# Patient Record
Sex: Male | Born: 1955 | Race: Black or African American | Hispanic: No | Marital: Single | Smoking: Former smoker
Health system: Southern US, Community
[De-identification: ages and names within clinical notes are randomized; demographics above are authoritative.]

## PROBLEM LIST (undated history)

## (undated) DIAGNOSIS — I4892 Unspecified atrial flutter: Secondary | ICD-10-CM

## (undated) DIAGNOSIS — G4730 Sleep apnea, unspecified: Secondary | ICD-10-CM

## (undated) DIAGNOSIS — E119 Type 2 diabetes mellitus without complications: Secondary | ICD-10-CM

## (undated) DIAGNOSIS — F32A Depression, unspecified: Secondary | ICD-10-CM

## (undated) DIAGNOSIS — K219 Gastro-esophageal reflux disease without esophagitis: Secondary | ICD-10-CM

## (undated) DIAGNOSIS — I10 Essential (primary) hypertension: Secondary | ICD-10-CM

## (undated) DIAGNOSIS — F419 Anxiety disorder, unspecified: Secondary | ICD-10-CM

## (undated) DIAGNOSIS — R0602 Shortness of breath: Secondary | ICD-10-CM

## (undated) DIAGNOSIS — F329 Major depressive disorder, single episode, unspecified: Secondary | ICD-10-CM

## (undated) DIAGNOSIS — I5023 Acute on chronic systolic (congestive) heart failure: Secondary | ICD-10-CM

## (undated) HISTORY — PX: TONSILLECTOMY: SUR1361

## (undated) HISTORY — PX: CARDIAC CATHETERIZATION: SHX172

---

## 2013-05-06 ENCOUNTER — Emergency Department (INDEPENDENT_AMBULATORY_CARE_PROVIDER_SITE_OTHER): Payer: Self-pay

## 2013-05-06 ENCOUNTER — Inpatient Hospital Stay (HOSPITAL_COMMUNITY)
Admission: EM | Admit: 2013-05-06 | Discharge: 2013-05-12 | DRG: 308 | Disposition: A | Discharge status: Home / Self Care | Payer: Self-pay | Attending: Cardiology | Admitting: Cardiology

## 2013-05-06 ENCOUNTER — Encounter (HOSPITAL_COMMUNITY): Payer: Self-pay | Admitting: Emergency Medicine

## 2013-05-06 ENCOUNTER — Emergency Department (INDEPENDENT_AMBULATORY_CARE_PROVIDER_SITE_OTHER)
Admission: EM | Admit: 2013-05-06 | Discharge: 2013-05-06 | Disposition: A | Discharge status: Hospice | Payer: Self-pay | Source: Home / Self Care | Attending: Emergency Medicine | Admitting: Emergency Medicine

## 2013-05-06 DIAGNOSIS — Z79899 Other long term (current) drug therapy: Secondary | ICD-10-CM

## 2013-05-06 DIAGNOSIS — E876 Hypokalemia: Secondary | ICD-10-CM | POA: Diagnosis present

## 2013-05-06 DIAGNOSIS — I509 Heart failure, unspecified: Secondary | ICD-10-CM

## 2013-05-06 DIAGNOSIS — I209 Angina pectoris, unspecified: Secondary | ICD-10-CM

## 2013-05-06 DIAGNOSIS — I428 Other cardiomyopathies: Secondary | ICD-10-CM | POA: Diagnosis present

## 2013-05-06 DIAGNOSIS — Z23 Encounter for immunization: Secondary | ICD-10-CM

## 2013-05-06 DIAGNOSIS — I10 Essential (primary) hypertension: Secondary | ICD-10-CM

## 2013-05-06 DIAGNOSIS — N419 Inflammatory disease of prostate, unspecified: Secondary | ICD-10-CM

## 2013-05-06 DIAGNOSIS — E1142 Type 2 diabetes mellitus with diabetic polyneuropathy: Secondary | ICD-10-CM

## 2013-05-06 DIAGNOSIS — E1149 Type 2 diabetes mellitus with other diabetic neurological complication: Secondary | ICD-10-CM

## 2013-05-06 DIAGNOSIS — I119 Hypertensive heart disease without heart failure: Secondary | ICD-10-CM | POA: Diagnosis present

## 2013-05-06 DIAGNOSIS — I2789 Other specified pulmonary heart diseases: Secondary | ICD-10-CM | POA: Diagnosis present

## 2013-05-06 DIAGNOSIS — I4891 Unspecified atrial fibrillation: Secondary | ICD-10-CM

## 2013-05-06 DIAGNOSIS — I5023 Acute on chronic systolic (congestive) heart failure: Secondary | ICD-10-CM

## 2013-05-06 DIAGNOSIS — E119 Type 2 diabetes mellitus without complications: Secondary | ICD-10-CM

## 2013-05-06 DIAGNOSIS — I4892 Unspecified atrial flutter: Principal | ICD-10-CM

## 2013-05-06 DIAGNOSIS — F17200 Nicotine dependence, unspecified, uncomplicated: Secondary | ICD-10-CM | POA: Diagnosis present

## 2013-05-06 DIAGNOSIS — I110 Hypertensive heart disease with heart failure: Secondary | ICD-10-CM | POA: Diagnosis present

## 2013-05-06 DIAGNOSIS — Z7901 Long term (current) use of anticoagulants: Secondary | ICD-10-CM

## 2013-05-06 DIAGNOSIS — I5021 Acute systolic (congestive) heart failure: Secondary | ICD-10-CM | POA: Diagnosis present

## 2013-05-06 DIAGNOSIS — Z6841 Body Mass Index (BMI) 40.0 and over, adult: Secondary | ICD-10-CM

## 2013-05-06 DIAGNOSIS — Z7982 Long term (current) use of aspirin: Secondary | ICD-10-CM

## 2013-05-06 HISTORY — DX: Unspecified atrial flutter: I48.92

## 2013-05-06 HISTORY — DX: Shortness of breath: R06.02

## 2013-05-06 HISTORY — DX: Major depressive disorder, single episode, unspecified: F32.9

## 2013-05-06 HISTORY — DX: Type 2 diabetes mellitus without complications: E11.9

## 2013-05-06 HISTORY — DX: Gastro-esophageal reflux disease without esophagitis: K21.9

## 2013-05-06 HISTORY — DX: Acute on chronic systolic (congestive) heart failure: I50.23

## 2013-05-06 HISTORY — DX: Essential (primary) hypertension: I10

## 2013-05-06 HISTORY — DX: Sleep apnea, unspecified: G47.30

## 2013-05-06 HISTORY — DX: Depression, unspecified: F32.A

## 2013-05-06 HISTORY — DX: Anxiety disorder, unspecified: F41.9

## 2013-05-06 LAB — CBC
HCT: 39.6 % (ref 39.0–52.0)
MCHC: 32.8 g/dL (ref 30.0–36.0)
MCV: 81.8 fL (ref 78.0–100.0)
Platelets: 157 10*3/uL (ref 150–400)
RBC: 4.84 MIL/uL (ref 4.22–5.81)
RDW: 15.5 % (ref 11.5–15.5)

## 2013-05-06 LAB — MRSA PCR SCREENING: MRSA by PCR: NEGATIVE

## 2013-05-06 LAB — POCT I-STAT TROPONIN I: Troponin i, poc: 0.05 ng/mL (ref 0.00–0.08)

## 2013-05-06 LAB — BASIC METABOLIC PANEL
BUN: 14 mg/dL (ref 6–23)
Calcium: 8.6 mg/dL (ref 8.4–10.5)
Creatinine, Ser: 0.95 mg/dL (ref 0.50–1.35)
GFR calc Af Amer: >90 mL/min (ref >90)
GFR calc non Af Amer: >90 mL/min (ref >90)
Potassium: 3.5 mEq/L (ref 3.5–5.1)

## 2013-05-06 LAB — GLUCOSE, CAPILLARY: Glucose-Capillary: 172 mg/dL — ABNORMAL HIGH (ref 70–99)

## 2013-05-06 MED ORDER — SODIUM CHLORIDE 0.45 % IV SOLN: INTRAVENOUS | Status: DC

## 2013-05-06 MED ORDER — NITROGLYCERIN 0.4 MG SL SUBL
SUBLINGUAL_TABLET | SUBLINGUAL | Status: AC
  Filled 2013-05-06: qty 25

## 2013-05-06 MED ORDER — ASPIRIN 81 MG PO CHEW
CHEWABLE_TABLET | ORAL | Status: AC
  Filled 2013-05-06: qty 1

## 2013-05-06 MED ORDER — DILTIAZEM HCL 100 MG IV SOLR
5.0000 mg/h | INTRAVENOUS | Status: DC
  Administered 2013-05-06 – 2013-05-08 (×5): 10 mg/h via INTRAVENOUS
  Filled 2013-05-06 (×6): qty 100

## 2013-05-06 MED ORDER — DILTIAZEM LOAD VIA INFUSION
15.0000 mg | Freq: Once | INTRAVENOUS | Status: AC
  Administered 2013-05-06: 15 mg via INTRAVENOUS
  Filled 2013-05-06 (×2): qty 15

## 2013-05-06 MED ORDER — POTASSIUM CHLORIDE CRYS ER 20 MEQ PO TBCR
40.0000 meq | EXTENDED_RELEASE_TABLET | Freq: Two times a day (BID) | ORAL | Status: DC
  Administered 2013-05-06 – 2013-05-12 (×12): 40 meq via ORAL
  Filled 2013-05-06 (×16): qty 2

## 2013-05-06 MED ORDER — ASPIRIN 81 MG PO CHEW
324.0000 mg | CHEWABLE_TABLET | Freq: Once | ORAL | Status: AC
  Administered 2013-05-06: 324 mg via ORAL

## 2013-05-06 MED ORDER — SODIUM CHLORIDE 0.9 % IV SOLN
INTRAVENOUS | Status: DC
  Administered 2013-05-06: 12:00:00 via INTRAVENOUS

## 2013-05-06 MED ORDER — INFLUENZA VAC SPLIT QUAD 0.5 ML IM SUSP
0.5000 mL | INTRAMUSCULAR | Status: AC
  Administered 2013-05-07: 0.5 mL via INTRAMUSCULAR
  Filled 2013-05-06: qty 0.5

## 2013-05-06 MED ORDER — PNEUMOCOCCAL VAC POLYVALENT 25 MCG/0.5ML IJ INJ
0.5000 mL | INJECTION | INTRAMUSCULAR | Status: AC
  Administered 2013-05-07: 0.5 mL via INTRAMUSCULAR
  Filled 2013-05-06: qty 0.5

## 2013-05-06 MED ORDER — FUROSEMIDE 10 MG/ML IJ SOLN
60.0000 mg | Freq: Once | INTRAMUSCULAR | Status: AC
  Administered 2013-05-06: 60 mg via INTRAVENOUS
  Filled 2013-05-06: qty 6

## 2013-05-06 MED ORDER — ASPIRIN 81 MG PO CHEW
CHEWABLE_TABLET | ORAL | Status: AC
  Filled 2013-05-06: qty 3

## 2013-05-06 NOTE — ED Notes (Signed)
Pharmacy states they are currently mixing the cardizem drip, MD aware of cause for delay, will continue to monitor

## 2013-05-06 NOTE — H&P (Addendum)
Patient ID: Adam Lynn MRN: [id number], DOB/AGE: [birth year]   Admit date: 05/06/2013   Primary Physician: No PCP Per Patient Primary Cardiologist: Gentry Fitz Pt. Profile:  CHF, chest pain  Problem List  History reviewed. No pertinent past medical history.  History reviewed. No pertinent past surgical history.   Allergies  No Known Allergies  HPI  57 year old male with no prior medical history, on no medical therapy, ongoing smoker who presented with progressively worsening shortness of breath over the past week associated with chest tightness/heaviness. He denies any palpitations or syncope. He also has orthopnea and PND. He is currently NYHA class III, unable to walk even short distances without getting short of breath.  He was found to be in atrial flutter and started on Cardizem drip by the ER physician.  He has no prior h/o CAD or CHF or rhythm abnormalities. He has recently started a new exercise plan and has lost 50 pounds of weight. He denies nausea and vomiting. He states he had a heart catheterization done in [place] several years ago and had no stents placed at that time.   Home Medications  Prior to Admission medications   Medication Sig Start Date End Date Taking? Authorizing Provider  aspirin EC 81 MG tablet Take 81 mg by mouth daily.   Yes Historical Provider, MD  guaiFENesin (MUCINEX) 600 MG 12 hr tablet Take 600 mg by mouth 2 (two) times daily.   Yes Historical Provider, MD  hydrochlorothiazide (HYDRODIURIL) 25 MG tablet Take 25 mg by mouth daily.   Yes Historical Provider, MD  metFORMIN (GLUCOPHAGE) 500 MG tablet Take 500 mg by mouth daily with breakfast.   Yes Historical Provider, MD  quinapril (ACCUPRIL) 10 MG tablet Take 10 mg by mouth at bedtime.   Yes Historical Provider, MD  sitaGLIPtin (JANUVIA) 50 MG tablet Take 50 mg by mouth daily.   Yes Historical Provider, MD    Family History  History reviewed. No pertinent family history.  Social  History  History   Social History  . Marital Status: Single    Spouse Name: N/A    Number of Children: N/A  . Years of Education: N/A   Occupational History  . Not on file.   Social History Main Topics  . Smoking status: Not on file  . Smokeless tobacco: Not on file  . Alcohol Use: Not on file  . Drug Use: Not on file  . Sexual Activity: Not on file   Other Topics Concern  . Not on file   Social History Narrative  . No narrative on file     Review of Systems General:  No chills, fever, night sweats or weight changes.  Cardiovascular:  No chest pain, dyspnea on exertion, edema, orthopnea, palpitations, paroxysmal nocturnal dyspnea. Dermatological: No rash, lesions/masses Respiratory: No cough, dyspnea Urologic: No hematuria, dysuria Abdominal:   No nausea, vomiting, diarrhea, bright red blood per rectum, melena, or hematemesis Neurologic:  No visual changes, wkns, changes in mental status. All other systems reviewed and are otherwise negative except as noted above.  Physical Exam  Blood pressure 137/104, pulse 128, resp. rate 26, SpO2 95.00%.  General: Pleasant, NAD Psych: Normal affect. Neuro: Alert and oriented X 3. Moves all extremities spontaneously. HEENT: Normal  Neck: Supple without bruits, JVD + 8 cm. Lungs:  Resp regular and unlabored, crackles B/L Heart: RRR no s3, s4, or murmurs. Abdomen: Soft, non-tender, non-distended, BS + x 4.  Extremities: No clubbing, cyanosis, minimal LE non-pitting edema.  DP/PT/Radials 2+ and equal bilaterally.  Labs  No results found for this basename: CKTOTAL, CKMB, TROPONINI,  in the last 72 hours Lab Results  Component Value Date   WBC 9.1 05/06/2013   HGB 13.0 05/06/2013   HCT 39.6 05/06/2013   MCV 81.8 05/06/2013   PLT 157 05/06/2013    Recent Labs Lab 05/06/13 1313  NA 138  K 3.5  CL 103  CO2 26  BUN 14  CREATININE 0.95  CALCIUM 8.6  GLUCOSE 198*   Radiology/Studies  Dg Chest 2 View  05/06/2013    CLINICAL DATA:  Productive cough with chest tightness for 4 days. History of diabetes and hypertension.  EXAM: CHEST  2 VIEW  COMPARISON:  None.  FINDINGS: The heart is mildly enlarged. There is vascular congestion with fissural thickening and small bilateral pleural effusions suspicious for mild congestive heart failure. No confluent airspace opacity is identified. There are degenerative changes throughout the spine.  IMPRESSION: Cardiomegaly with interstitial edema and small pleural effusions suspicious for mild congestive heart failure.   Electronically Signed   By: Roxy Horseman M.D.   On: 05/06/2013 11:38    ECG  Atrial flutter with 2:1 conduction  ASSESSMENT AND PLAN  57 year old male  1. Atrial flutter - with 2:1 conduction, ventricular rate 142 BPM, started on Cardizem drip, we will start Heparin as it has certainly started > 48 hours ago. The plan is to perform TEE with cardioversion tomorrow. We will admit him to the stepdown unit.   2. CHF - might be tachycardia induced - we will start iv Lasix, Crea is normal.  3. Chest tightness - most probably secondary to severe SOB, we will continue rule out ACS. First troponin negative  4. Hypokalemia - replace with KCl  5. Hypertension - on Cardizem right now, we will decide on long term medication based on the echocardiogram  6. Obesity - the patient is already involved in an exercise program  7. Smoking cessation - counselling provided  Admit to stepdown   Signed, Lars Masson, MD 05/06/2013, 6:44 PM

## 2013-05-06 NOTE — ED Notes (Signed)
Call pharmacy in reference to Cardizem, stated they would send it up when ready

## 2013-05-06 NOTE — ED Notes (Signed)
Cardiology at bedside.

## 2013-05-06 NOTE — ED Notes (Signed)
C/o sinus problems since weather has changed.  Patient states he has productive cough with different color mucous.  Patient states he has sob when he walks.  Patient states tailbone is sore and has pain when urinating.

## 2013-05-06 NOTE — ED Provider Notes (Signed)
CSN: [id number]     Arrival date & time 05/06/13  1308 History   First MD Initiated Contact with Patient 05/06/13 1350     Chief Complaint  Patient presents with  . Chest Pain    HPI Patient presents to the emergency department from urgent care today with worsening shortness of breath over the past 4 days with increasing orthopnea and lower extremity edema.  No prior history of congestive heart failure.  Chest x-ray was concerning for heart failure and thus the patient was sent to emergency department for evaluation.  Aspirin given at urgent care.  Patient did have some chest tightness and therefore sublingual nitroglycerin was given x2.  The patient denies chest pain at this time.  He has no palpitations.  He's never had an abnormal heart rhythm.  His recent start a new exercise plan and has lost 50 pounds of weight.  He denies nausea and vomiting.  He reports worsening exertional shortness of breath.  He states he had a heart catheterization done in [place] several years ago and had no stents placed at that time.    Past medical history: None Surgical history: None  Social history: Positive for tobacco  Review of Systems  All other systems reviewed and are negative.    Allergies  Review of patient's allergies indicates no known allergies.  Home Medications   Current Outpatient Rx  Name  Route  Sig  Dispense  Refill  . aspirin EC 81 MG tablet   Oral   Take 81 mg by mouth daily.         Marland Kitchen guaiFENesin (MUCINEX) 600 MG 12 hr tablet   Oral   Take 600 mg by mouth 2 (two) times daily.         . hydrochlorothiazide (HYDRODIURIL) 25 MG tablet   Oral   Take 25 mg by mouth daily.         . metFORMIN (GLUCOPHAGE) 500 MG tablet   Oral   Take 500 mg by mouth daily with breakfast.         . quinapril (ACCUPRIL) 10 MG tablet   Oral   Take 10 mg by mouth at bedtime.         . sitaGLIPtin (JANUVIA) 50 MG tablet   Oral   Take 50 mg by mouth daily.          BP 142/113   Pulse 56  Resp 19  SpO2 98% Physical Exam  Nursing note and vitals reviewed. Constitutional: He is oriented to person, place, and time. He appears well-developed and well-nourished.  HENT:  Head: Normocephalic and atraumatic.  Eyes: EOM are normal.  Neck: Normal range of motion.  Cardiovascular: Regular rhythm, normal heart sounds and intact distal pulses.   Tachycardia  Pulmonary/Chest: Effort normal. No respiratory distress. He has rales.  Abdominal: Soft. He exhibits no distension. There is no tenderness.  Musculoskeletal: Normal range of motion.  1+ lower extremity edema  Neurological: He is alert and oriented to person, place, and time.  Skin: Skin is warm and dry.  Psychiatric: He has a normal mood and affect. Judgment normal.    ED Course  Procedures (including critical care time) Labs Review Labs Reviewed  BASIC METABOLIC PANEL - Abnormal; Notable for the following:    Glucose, Bld 198 (*)    All other components within normal limits  PRO B NATRIURETIC PEPTIDE - Abnormal; Notable for the following:    Pro B Natriuretic peptide (BNP) 864.6 (*)  All other components within normal limits  CBC  POCT I-STAT TROPONIN I   Imaging Review Dg Chest 2 View  05/06/2013   CLINICAL DATA:  Productive cough with chest tightness for 4 days. History of diabetes and hypertension.  EXAM: CHEST  2 VIEW  COMPARISON:  None.  FINDINGS: The heart is mildly enlarged. There is vascular congestion with fissural thickening and small bilateral pleural effusions suspicious for mild congestive heart failure. No confluent airspace opacity is identified. There are degenerative changes throughout the spine.  IMPRESSION: Cardiomegaly with interstitial edema and small pleural effusions suspicious for mild congestive heart failure.   Electronically Signed   By: Roxy Horseman M.D.   On: 05/06/2013 11:38  I personally reviewed the imaging tests through PACS system I reviewed available ER/hospitalization  records through the EMR   ECG interpretation 1322pm  Date: 05/06/2013  Rate: 142  Rhythm: Tachycardic rate.  Possible atrial tachycardia versus 2:1 atrial flutter  QRS Axis: normal  Intervals: normal  ST/T Wave abnormalities: normal  Conduction Disutrbances: Right bundle branch block  Narrative Interpretation:   Old EKG Reviewed: No prior EKG available   ECG interpretation 1648  Date: 05/06/2013  Rate: 118  Rhythm: atrial flutter  QRS Axis: normal  Intervals: normal  ST/T Wave abnormalities: normal  Conduction Disutrbances: none  Narrative Interpretation:   Old EKG Reviewed: No significant changes noted          MDM   1. Atrial flutter   2. CHF (congestive heart failure)    Short consistent heart rate in the 140s.  This appears to be 2:1 atrial flutter.  Likely his atrial flutter is put him into congestive heart failure.  The patient will be started on a Cardizem drip.  He has no active chest pain at this time.  No ischemic changes on his EKG.  His initial troponin is normal.  [facility] to see    Lyanne Co, MD 05/06/13 1701

## 2013-05-06 NOTE — Discharge Planning (Signed)
[facility] Adam E.- Community Liaison  Patient is to follow up with the [facility] to establish primary care on 05/19/13 at 9:15am, patient will also be obtaining the orange card. Patient is aware of this appointment and was given the application with my contact information for any questions or concerns.

## 2013-05-06 NOTE — ED Notes (Signed)
Pt in c/o chest pain and shortness of breath x4 days, went into urgent care today for further evaluation, pt was tachycardic upon arrival, rating pain 4/10, chest xray completed at urgent care, pt has been c/o cough with yellow sputum during this time also, IV started at urgent care, ASA given at urgent care, nitro SL given x2

## 2013-05-06 NOTE — ED Provider Notes (Signed)
Chief Complaint:   Chief Complaint  Patient presents with  . Facial Pain  . Dysuria    History of Present Illness:   Adam Lynn is a 57 year old male with poorly controlled diabetes, poorly controlled hypertension, and history of cigarette smoking who presents with a multitude of complaints today. Among his complaints are chest tightness for the past 3 days. This has been localized to the upper chest with radiation to both shoulders. It's worse with exertion such as walking and better with rest. He's also had sweats. He denies any nausea or vomiting. He's had shortness of breath with exertion and ankle edema. He's had no definite history of cardiac disease and does not think he's ever had a heart attack in the past.  He also has had a 2 month history of nasal congestion with headache, facial pain, and diminished hearing on the left. He's had a cough productive yellow sputum with streaks of blood. He also has a five-day history of prostate pain and burning with urination. He has normal urinary flow. His toes feel numb and tingly and painful. He does not have a primary care physician. In He states his CABG range has been 130 to 160. He's not had any hypoglycemia or diabetic symptoms. He denies any history of kidney disease or eye problems. He admits he had a poor diet and eats mostly junk food.  Review of Systems:  Other than noted above, the patient denies any of the following symptoms. Systemic:  No fever, chills, sweats, or fatigue. ENT:  No nasal congestion, rhinorrhea, or sore throat. Pulmonary:  No cough, wheezing, shortness of breath, sputum production, hemoptysis. Cardiac:  No palpitations, rapid heartbeat, dizziness, presyncope or syncope. GI:  No abdominal pain, heartburn, nausea, or vomiting. Ext:  No leg pain or swelling.  PMFSH:  Past medical history, family history, social history, meds, and allergies were reviewed and updated as needed. He's allergic to penicillin. He takes  hydrochlorothiazide, Accupril, Januvia, and metformin. He has high blood pressure and diabetes.  Physical Exam:   Vital signs:  BP 165/118  Pulse 143  Temp(Src) 98.2 F (36.8 C) (Oral)  Resp 18  SpO2 96% Gen:  Alert, oriented, in no distress, skin warm and dry. Eye:  PERRL, lids and conjunctivas normal.  Sclera non-icteric. ENT:  Mucous membranes moist, pharynx clear. Both TMs are retracted, red, and dull. Neck:  Supple, no adenopathy or tenderness.  No JVD. Lungs:  Clear to auscultation, no wheezes, rales or rhonchi.  No respiratory distress. Heart:  Regular rhythm.  No gallops, murmers, clicks or rubs. Chest:  No chest wall tenderness. Abdomen:  Soft, nontender, no organomegaly or mass.  Bowel sounds normal.  No pulsatile abdominal mass or bruit. Ext:  No edema.  No calf tenderness and Homann's sign negative.  Pulses full and equal. Skin:  Warm and dry.  No rash.  Labs:   Results for orders placed during the hospital encounter of 05/06/13  GLUCOSE, CAPILLARY      Result Value Range   Glucose-Capillary 172 (*) 70 - 99 mg/dL     Radiology:  Dg Chest 2 View  05/06/2013   CLINICAL DATA:  Productive cough with chest tightness for 4 days. History of diabetes and hypertension.  EXAM: CHEST  2 VIEW  COMPARISON:  None.  FINDINGS: The heart is mildly enlarged. There is vascular congestion with fissural thickening and small bilateral pleural effusions suspicious for mild congestive heart failure. No confluent airspace opacity is identified. There are degenerative changes  throughout the spine.  IMPRESSION: Cardiomegaly with interstitial edema and small pleural effusions suspicious for mild congestive heart failure.   Electronically Signed   By: Roxy Horseman M.D.   On: 05/06/2013 11:38   I reviewed the images independently and personally and concur with the radiologist's findings.  EKG:   Date: 05/06/2013  Rate: 142  Rhythm: sinus tachycardia  QRS Axis: normal  Intervals: normal  ST/T Wave  abnormalities: normal  Conduction Disutrbances:right bundle branch block  Narrative Interpretation: Sinus tachycardia, right bundle branch block, septal infarct, age undetermined.  Old EKG Reviewed: none available  Course in Urgent Care Center:   He was begun on normal saline intravenously, oxygen via nasal cannula, monitored, and given aspirin 325 mg by mouth. He was not given nitroglycerin since he does not have any chest pain or discomfort right now.  Assessment:  The primary encounter diagnosis was Angina pectoris. Diagnoses of CHF (congestive heart failure), Prostatitis, and Type 2 diabetes mellitus with neurological manifestations were also pertinent to this visit.  Symptoms are concerning for new onset angina. He also appears to have symptoms and signs of congestive heart failure. Will need complete cardiac workup.  Plan:   The patient was transferred to the ED via [facility] in stable condition.  Medical Decision Making:  57 year old poorly controlled diabetic, smoker, poorly controlled HT presents today with 3 day history of upper chest tightness radiating to shoulders, worse with exertion, better with rest.  No pain right now.  He also has nasal congestion, cough, hemoptysis, sweats, prostate pain, dysuria, and numbness and pain in toes.  On exam he is diaphoretic and tachycardic.  His CBG is 172.  EKG shows sinus tach, RBBB, and previous septal infarct.  His CXR shows cardiomegaly and interstitial edema consistent with CHF.  He will be sent by [facility].     Reuben Likes, MD 05/06/13 1213

## 2013-05-06 NOTE — ED Notes (Signed)
Called pharmacy about delay in receiving Cardizem, states that medication is now being mixed, MD and charge RN aware.

## 2013-05-07 ENCOUNTER — Encounter (HOSPITAL_COMMUNITY)
Admission: EM | Disposition: A | Discharge status: Home / Self Care | Payer: Self-pay | Source: Home / Self Care | Attending: Cardiology

## 2013-05-07 DIAGNOSIS — I4892 Unspecified atrial flutter: Secondary | ICD-10-CM

## 2013-05-07 DIAGNOSIS — I10 Essential (primary) hypertension: Secondary | ICD-10-CM

## 2013-05-07 DIAGNOSIS — I119 Hypertensive heart disease without heart failure: Secondary | ICD-10-CM | POA: Diagnosis present

## 2013-05-07 HISTORY — PX: TEE WITHOUT CARDIOVERSION: SHX5443

## 2013-05-07 HISTORY — PX: CARDIOVERSION: SHX1299

## 2013-05-07 LAB — GLUCOSE, CAPILLARY
Glucose-Capillary: 177 mg/dL — ABNORMAL HIGH (ref 70–99)
Glucose-Capillary: 136 mg/dL — ABNORMAL HIGH (ref 70–99)
Glucose-Capillary: 142 mg/dL — ABNORMAL HIGH (ref 70–99)
Glucose-Capillary: 209 mg/dL — ABNORMAL HIGH (ref 70–99)

## 2013-05-07 LAB — TSH: TSH: 0.392 u[IU]/mL (ref 0.350–4.500)

## 2013-05-07 LAB — BASIC METABOLIC PANEL
BUN: 9 mg/dL (ref 6–23)
CO2: 27 mEq/L (ref 19–32)
Chloride: 102 mEq/L (ref 96–112)
Creatinine, Ser: 0.92 mg/dL (ref 0.50–1.35)
GFR calc Af Amer: >90 mL/min (ref >90)
Potassium: 3.6 mEq/L (ref 3.5–5.1)

## 2013-05-07 SURGERY — Surgical Case
Anesthesia: *Unknown

## 2013-05-07 SURGERY — ECHOCARDIOGRAM, TRANSESOPHAGEAL
Anesthesia: Monitor Anesthesia Care | Laterality: Left

## 2013-05-07 MED ORDER — MIDAZOLAM HCL 5 MG/ML IJ SOLN
INTRAMUSCULAR | Status: AC
  Filled 2013-05-07: qty 2

## 2013-05-07 MED ORDER — LOPERAMIDE HCL 2 MG PO CAPS: 2.0000 mg | ORAL_CAPSULE | ORAL | Status: DC | PRN

## 2013-05-07 MED ORDER — ALUM & MAG HYDROXIDE-SIMETH 200-200-20 MG/5ML PO SUSP
30.0000 mL | ORAL | Status: DC | PRN
  Administered 2013-05-10: 30 mL via ORAL
  Filled 2013-05-07: qty 30

## 2013-05-07 MED ORDER — MIDAZOLAM HCL 5 MG/5ML IJ SOLN
INTRAMUSCULAR | Status: DC | PRN
  Administered 2013-05-07: 2 mg via INTRAVENOUS
  Administered 2013-05-07: 3 mg
  Administered 2013-05-07 (×2): 2 mg via INTRAVENOUS
  Administered 2013-05-07: 1.5 mg via INTRAVENOUS

## 2013-05-07 MED ORDER — METOPROLOL TARTRATE 1 MG/ML IV SOLN
INTRAVENOUS | Status: AC
  Filled 2013-05-07: qty 5

## 2013-05-07 MED ORDER — LISINOPRIL 10 MG PO TABS
10.0000 mg | ORAL_TABLET | Freq: Every day | ORAL | Status: DC
  Administered 2013-05-07 – 2013-05-09 (×3): 10 mg via ORAL
  Filled 2013-05-07 (×3): qty 1

## 2013-05-07 MED ORDER — MAGNESIUM HYDROXIDE 400 MG/5ML PO SUSP: 30.0000 mL | Freq: Every day | ORAL | Status: DC | PRN

## 2013-05-07 MED ORDER — PRAMOXINE-ZINC OXIDE IN MO 1-12.5 % RE OINT
1.0000 "application " | TOPICAL_OINTMENT | Freq: Three times a day (TID) | RECTAL | Status: DC | PRN
  Filled 2013-05-07: qty 28.3

## 2013-05-07 MED ORDER — ENOXAPARIN SODIUM 150 MG/ML ~~LOC~~ SOLN
150.0000 mg | Freq: Two times a day (BID) | SUBCUTANEOUS | Status: DC
  Administered 2013-05-07: 150 mg via SUBCUTANEOUS
  Filled 2013-05-07 (×2): qty 1

## 2013-05-07 MED ORDER — ZOLPIDEM TARTRATE 5 MG PO TABS
5.0000 mg | ORAL_TABLET | Freq: Every evening | ORAL | Status: DC | PRN
  Administered 2013-05-10 – 2013-05-11 (×2): 5 mg via ORAL
  Filled 2013-05-07 (×2): qty 1

## 2013-05-07 MED ORDER — RIVAROXABAN 20 MG PO TABS
20.0000 mg | ORAL_TABLET | Freq: Once | ORAL | Status: AC
  Administered 2013-05-07: 20 mg via ORAL
  Filled 2013-05-07: qty 1

## 2013-05-07 MED ORDER — GUAIFENESIN-DM 100-10 MG/5ML PO SYRP
15.0000 mL | ORAL_SOLUTION | ORAL | Status: DC | PRN
  Administered 2013-05-08 – 2013-05-09 (×2): 15 mL via ORAL
  Filled 2013-05-07 (×2): qty 15

## 2013-05-07 MED ORDER — FENTANYL CITRATE 0.05 MG/ML IJ SOLN
INTRAMUSCULAR | Status: DC | PRN
  Administered 2013-05-07 (×2): 25 ug via INTRAVENOUS
  Administered 2013-05-07: 50 ug via INTRAVENOUS

## 2013-05-07 MED ORDER — FENTANYL CITRATE 0.05 MG/ML IJ SOLN
INTRAMUSCULAR | Status: AC
  Filled 2013-05-07: qty 2

## 2013-05-07 MED ORDER — ACETAMINOPHEN 325 MG PO TABS
650.0000 mg | ORAL_TABLET | Freq: Four times a day (QID) | ORAL | Status: DC | PRN
  Administered 2013-05-07 – 2013-05-08 (×2): 650 mg via ORAL
  Filled 2013-05-07 (×2): qty 2

## 2013-05-07 MED ORDER — HYDROCORTISONE 1 % EX CREA
1.0000 "application " | TOPICAL_CREAM | Freq: Three times a day (TID) | CUTANEOUS | Status: DC | PRN
  Filled 2013-05-07: qty 28

## 2013-05-07 MED ORDER — INSULIN ASPART 100 UNIT/ML ~~LOC~~ SOLN
2.0000 [IU] | Freq: Three times a day (TID) | SUBCUTANEOUS | Status: DC
  Administered 2013-05-07: 2 [IU] via SUBCUTANEOUS
  Administered 2013-05-08: 4 [IU] via SUBCUTANEOUS
  Administered 2013-05-08 – 2013-05-09 (×3): 2 [IU] via SUBCUTANEOUS
  Administered 2013-05-10: 4 [IU] via SUBCUTANEOUS
  Administered 2013-05-11 (×2): 2 [IU] via SUBCUTANEOUS
  Administered 2013-05-11: 18:00:00 4 [IU] via SUBCUTANEOUS
  Administered 2013-05-12: 06:00:00 2 [IU] via SUBCUTANEOUS

## 2013-05-07 MED ORDER — SODIUM CHLORIDE 0.9 % IV SOLN: INTRAVENOUS | Status: DC

## 2013-05-07 MED ORDER — RIVAROXABAN 20 MG PO TABS
20.0000 mg | ORAL_TABLET | Freq: Every day | ORAL | Status: DC
  Administered 2013-05-08 – 2013-05-11 (×3): 20 mg via ORAL
  Filled 2013-05-07 (×5): qty 1

## 2013-05-07 NOTE — CV Procedure (Signed)
    Transesophageal Echocardiogram Note  Adam Lynn [id number] [birth year]  Procedure: Transesophageal Echocardiogram Indications: Atrial flutter, CHF  Procedure Details Consent: Obtained Time Out: Verified patient identification, verified procedure, site/side was marked, verified correct patient position, special equipment/implants available, Radiology Safety Procedures followed,  medications/allergies/relevent history reviewed, required imaging and test results available.  Performed  Medications: Fentanyl: 100 mcg Versed: 6 mg  Left Ventrical:  At least moderate LV dysfunction, LV EF 30%  Mitral Valve: thickened, 2 small jets of MR, overall MR mild  Aortic Valve: Normal, trileaflet, no AI  Tricuspid Valve: Normal, mild TR  Pulmonic Valve: Normal, trace PR  Left Atrium/ Left atrial appendage: Dilated LA, no thrombus in the LA or LAA, present smoke in LA and LAA, mildly decreased filling and emptying velocities 30 cm/sec  Atrial septum: no PFO  Aorta: mild atherosclerosis   Complications: No apparent complications Patient did tolerate procedure well.  -----------------------------------------------------------------------------------------------------------------------------------  Cardioversion Note  Adam Lynn [id number] 07/25/1955  Procedure: DC Cardioversion Indications: Atrial flutter  Procedure Details Consent: Obtained Time Out: Verified patient identification, verified procedure, site/side was marked, verified correct patient position, special equipment/implants available, Radiology Safety Procedures followed,  medications/allergies/relevent history reviewed, required imaging and test results available.  Performed  The patient has been on adequate anticoagulation.  The patient received IV 3 mg Versed for sedation.  Synchronous cardioversion was performed at 120 joules.  The cardioversion was successful    Complications: No apparent complications Patient  did tolerate procedure well.    Tobias Alexander, H MD, [facility] 05/07/2013, 2:07 PM

## 2013-05-07 NOTE — OR Nursing (Signed)
Patient not anticoagulated.  150 mg SQ Lovenox given lower left abdomen

## 2013-05-07 NOTE — Progress Notes (Addendum)
   Patient Name: Adam Lynn Date of Encounter: 05/07/2013     Active Problems:   * No active hospital problems. *    SUBJECTIVE  No chest pain, improved SOB.  CURRENT MEDS . influenza vac split quadrivalent PF  0.5 mL Intramuscular Tomorrow-1000  . pneumococcal 23 valent vaccine  0.5 mL Intramuscular Tomorrow-1000  . potassium chloride  40 mEq Oral BID    OBJECTIVE  Filed Vitals:   05/07/13 0600 05/07/13 0700 05/07/13 0755 05/07/13 0800  BP: 179/119 158/110  148/118  Pulse: 95 69    Temp:   98.7 F (37.1 C)   TempSrc:   Oral   Resp:    18  Height:      Weight:      SpO2: 80% 92% 93% 95%    Intake/Output Summary (Last 24 hours) at 05/07/13 1054 Last data filed at 05/07/13 1000  Gross per 24 hour  Intake  702.5 ml  Output   1900 ml  Net -1197.5 ml   Filed Weights   05/06/13 2000  Weight: 334 lb 7 oz (151.7 kg)    PHYSICAL EXAM  General: Pleasant, NAD. Neuro: Alert and oriented X 3. Moves all extremities spontaneously. Psych: Normal affect. HEENT:  Normal  Neck: Supple without bruits or JVD. Lungs:  Resp regular and unlabored, crackles B/L Heart: RRR no s3, s4, or murmurs. Abdomen: Soft, non-tender, non-distended, BS + x 4.  Extremities: No clubbing, cyanosis or edema. DP/PT/Radials 2+ and equal bilaterally.  Accessory Clinical Findings  CBC  Recent Labs  05/06/13 1313  WBC 9.1  HGB 13.0  HCT 39.6  MCV 81.8  PLT 157   Basic Metabolic Panel  Recent Labs  05/06/13 1313  NA 138  K 3.5  CL 103  CO2 26  GLUCOSE 198*  BUN 14  CREATININE 0.95  CALCIUM 8.6   Thyroid Function Tests  Recent Labs  05/06/13 2021  TSH 0.392    TELE  A flutter with RVR, 130-140 BPM  Radiology/Studies  Dg Chest 2 View  05/06/2013   CLINICAL DATA:  Productive cough with chest tightness for 4 days. History of diabetes and hypertension.  EXAM: CHEST  2 VIEW  COMPARISON:  None.  FINDINGS: The heart is mildly enlarged. There is vascular congestion with  fissural thickening and small bilateral pleural effusions suspicious for mild congestive heart failure. No confluent airspace opacity is identified. There are degenerative changes throughout the spine.  IMPRESSION: Cardiomegaly with interstitial edema and small pleural effusions suspicious for mild congestive heart failure.   Electronically Signed   By: Bill  Veazey M.D.   On: 05/06/2013 11:38    ASSESSMENT AND PLAN  57 year old male  1. Atrial flutter - with 2:1 conduction, ventricular rate 142 BPM, started on Cardizem drip yesterday, still RVR,  Plan is to perform TEE with CV this afternoon, keep NPO, continue Heperin drip. 2. CHF - might be tachycardia induced - negative 1.2 L since yesterday, continue iv Lasix, still fluid overloaded 3. Chest tightness - most probably secondary to severe SOB, troponin negative x 1 4. Hypokalemia - replace with KCl  5. Hypertension - on Cardizem right now, we will decide on long term medication based on the echocardiogram, add Lisinopril 10 mg po daily 6. Obesity - the patient is already involved in an exercise program  7. Smoking cessation - counselling provided  Signed, Therron Sells, H MD  

## 2013-05-07 NOTE — Interval H&P Note (Signed)
History and Physical Interval Note:  05/07/2013 2:06 PM  Adam Lynn  has presented today for surgery, with the diagnosis of a fib  The various methods of treatment have been discussed with the patient and family. After consideration of risks, benefits and other options for treatment, the patient has consented to  Procedure(s): TRANSESOPHAGEAL ECHOCARDIOGRAM (TEE) (Left) CARDIOVERSION (Left) as a surgical intervention .  The patient's history has been reviewed, patient examined, no change in status, stable for surgery.  I have reviewed the patient's chart and labs.  Questions were answered to the patient's satisfaction.     Tobias Alexander, H

## 2013-05-07 NOTE — H&P (View-Only) (Signed)
   Patient Name: Adam Lynn Date of Encounter: 05/07/2013     Active Problems:   * No active hospital problems. *    SUBJECTIVE  No chest pain, improved SOB.  CURRENT MEDS . influenza vac split quadrivalent PF  0.5 mL Intramuscular Tomorrow-1000  . pneumococcal 23 valent vaccine  0.5 mL Intramuscular Tomorrow-1000  . potassium chloride  40 mEq Oral BID    OBJECTIVE  Filed Vitals:   05/07/13 0600 05/07/13 0700 05/07/13 0755 05/07/13 0800  BP: 179/119 158/110  148/118  Pulse: 95 69    Temp:   98.7 F (37.1 C)   TempSrc:   Oral   Resp:    18  Height:      Weight:      SpO2: 80% 92% 93% 95%    Intake/Output Summary (Last 24 hours) at 05/07/13 1054 Last data filed at 05/07/13 1000  Gross per 24 hour  Intake  702.5 ml  Output   1900 ml  Net -1197.5 ml   Filed Weights   05/06/13 2000  Weight: 334 lb 7 oz (151.7 kg)    PHYSICAL EXAM  General: Pleasant, NAD. Neuro: Alert and oriented X 3. Moves all extremities spontaneously. Psych: Normal affect. HEENT:  Normal  Neck: Supple without bruits or JVD. Lungs:  Resp regular and unlabored, crackles B/L Heart: RRR no s3, s4, or murmurs. Abdomen: Soft, non-tender, non-distended, BS + x 4.  Extremities: No clubbing, cyanosis or edema. DP/PT/Radials 2+ and equal bilaterally.  Accessory Clinical Findings  CBC  Recent Labs  05/06/13 1313  WBC 9.1  HGB 13.0  HCT 39.6  MCV 81.8  PLT 157   Basic Metabolic Panel  Recent Labs  05/06/13 1313  NA 138  K 3.5  CL 103  CO2 26  GLUCOSE 198*  BUN 14  CREATININE 0.95  CALCIUM 8.6   Thyroid Function Tests  Recent Labs  05/06/13 2021  TSH 0.392    TELE  A flutter with RVR, 130-140 BPM  Radiology/Studies  Dg Chest 2 View  05/06/2013   CLINICAL DATA:  Productive cough with chest tightness for 4 days. History of diabetes and hypertension.  EXAM: CHEST  2 VIEW  COMPARISON:  None.  FINDINGS: The heart is mildly enlarged. There is vascular congestion with  fissural thickening and small bilateral pleural effusions suspicious for mild congestive heart failure. No confluent airspace opacity is identified. There are degenerative changes throughout the spine.  IMPRESSION: Cardiomegaly with interstitial edema and small pleural effusions suspicious for mild congestive heart failure.   Electronically Signed   By: Roxy Horseman M.D.   On: 05/06/2013 11:38    ASSESSMENT AND PLAN  57 year old male  1. Atrial flutter - with 2:1 conduction, ventricular rate 142 BPM, started on Cardizem drip yesterday, still RVR,  Plan is to perform TEE with CV this afternoon, keep NPO, continue Heperin drip. 2. CHF - might be tachycardia induced - negative 1.2 L since yesterday, continue iv Lasix, still fluid overloaded 3. Chest tightness - most probably secondary to severe SOB, troponin negative x 1 4. Hypokalemia - replace with KCl  5. Hypertension - on Cardizem right now, we will decide on long term medication based on the echocardiogram, add Lisinopril 10 mg po daily 6. Obesity - the patient is already involved in an exercise program  7. Smoking cessation - counselling provided  Signed, Lars Masson MD

## 2013-05-08 ENCOUNTER — Encounter (HOSPITAL_COMMUNITY): Payer: Self-pay | Admitting: Cardiology

## 2013-05-08 DIAGNOSIS — I517 Cardiomegaly: Secondary | ICD-10-CM

## 2013-05-08 LAB — BASIC METABOLIC PANEL
CO2: 28 mEq/L (ref 19–32)
Calcium: 9.3 mg/dL (ref 8.4–10.5)
Chloride: 102 mEq/L (ref 96–112)
Creatinine, Ser: 1.04 mg/dL (ref 0.50–1.35)
GFR calc Af Amer: >90 mL/min (ref >90)
Potassium: 3.7 mEq/L (ref 3.5–5.1)
Sodium: 140 mEq/L (ref 135–145)

## 2013-05-08 LAB — GLUCOSE, CAPILLARY
Glucose-Capillary: 150 mg/dL — ABNORMAL HIGH (ref 70–99)
Glucose-Capillary: 172 mg/dL — ABNORMAL HIGH (ref 70–99)
Glucose-Capillary: 127 mg/dL — ABNORMAL HIGH (ref 70–99)
Glucose-Capillary: 121 mg/dL — ABNORMAL HIGH (ref 70–99)

## 2013-05-08 MED ORDER — PERFLUTREN LIPID MICROSPHERE: 1.0000 mL | INTRAVENOUS | Status: AC | PRN

## 2013-05-08 MED ORDER — METOPROLOL SUCCINATE ER 25 MG PO TB24
25.0000 mg | ORAL_TABLET | Freq: Every day | ORAL | Status: DC
  Administered 2013-05-08 – 2013-05-12 (×5): 25 mg via ORAL
  Filled 2013-05-08 (×5): qty 1

## 2013-05-08 MED ORDER — PERFLUTREN LIPID MICROSPHERE
INTRAVENOUS | Status: AC
  Administered 2013-05-08: 2 mL
  Filled 2013-05-08: qty 10

## 2013-05-08 NOTE — Progress Notes (Signed)
  Echocardiogram 2D Echocardiogram with Definity has been performed.  Richard Holz 05/08/2013, 11:53 AM

## 2013-05-08 NOTE — Progress Notes (Addendum)
Patient Name: Adam Lynn Date of Encounter: 05/08/2013   Active Problems:   Essential hypertension   Morbid obesity   SUBJECTIVE  No chest pain, improved SOB.  CURRENT MEDS . insulin aspart  2-6 Units Subcutaneous TID WC  . lisinopril  10 mg Oral Daily  . potassium chloride  40 mEq Oral BID  . rivaroxaban  20 mg Oral Q supper   OBJECTIVE  Filed Vitals:   05/07/13 2000 05/07/13 2338 05/08/13 0336 05/08/13 0759  BP:  136/90 145/105 126/86  Pulse: 88 92 91 98  Temp:  98.9 F (37.2 C) 98.6 F (37 C) 98.5 F (36.9 C)  TempSrc:  Oral Oral Oral  Resp:    24  Height:      Weight:      SpO2: 92% 94% 94% 96%    Intake/Output Summary (Last 24 hours) at 05/08/13 0859 Last data filed at 05/08/13 0600  Gross per 24 hour  Intake    345 ml  Output   1050 ml  Net   -705 ml   Filed Weights   05/06/13 2000  Weight: 334 lb 7 oz (151.7 kg)   PHYSICAL EXAM  General: Pleasant, NAD. Neuro: Alert and oriented X 3. Moves all extremities spontaneously. Psych: Normal affect. HEENT:  Normal  Neck: Supple without bruits or JVD. Lungs:  Resp regular and unlabored, CTA Heart: RRR no s3, s4, or murmurs. Abdomen: Soft, non-tender, non-distended, BS + x 4.  Extremities: No clubbing, cyanosis or edema. DP/PT/Radials 2+ and equal bilaterally.  Accessory Clinical Findings  CBC  Recent Labs  05/06/13 1313  WBC 9.1  HGB 13.0  HCT 39.6  MCV 81.8  PLT 157   Basic Metabolic Panel  Recent Labs  05/07/13 1125 05/08/13 0416  NA 139 140  K 3.6 3.7  CL 102 102  CO2 27 28  GLUCOSE 157* 138*  BUN 9 15  CREATININE 0.92 1.04  CALCIUM 8.9 9.3   Thyroid Function Tests  Recent Labs  05/06/13 2021  TSH 0.392   TELE  SR, an episode of a-fib total 15 beats this am  Radiology/Studies  Dg Chest 2 View  05/06/2013   CLINICAL DATA:  Productive cough with chest tightness for 4 days. History of diabetes and hypertension.  EXAM: CHEST  2 VIEW  COMPARISON:  None.  FINDINGS:  The heart is mildly enlarged. There is vascular congestion with fissural thickening and small bilateral pleural effusions suspicious for mild congestive heart failure. No confluent airspace opacity is identified. There are degenerative changes throughout the spine.  IMPRESSION: Cardiomegaly with interstitial edema and small pleural effusions suspicious for mild congestive heart failure.   Electronically Signed   By: Roxy Horseman M.D.   On: 05/06/2013 11:38    ASSESSMENT AND PLAN  57 year old male   1. Atrial flutter - s/p TEE cardioversion yesterday, on Lovenox, starting on Xarelto. TEE showed moderately decreased biventricular function, possibly tachycardia induced. We will discontinue Cardizem and start Toprol XL 25 mg daily. Care management consulted for Xarelto cost.  We will follow him in our clinic with follow up echocardiogram to evaluate for LV/RV function.  2. Acute systolic CHF - possibly tachycardia induced - negative 0.7 L since yesterday, continue iv Lasix, still fluid overloaded. The patient will be most probably ready to go home tomorrow or Sunday.  3. Chest tightness - most probably secondary to severe SOB, troponin negative   4. Hypokalemia - resolved  5. Hypertension - add Lisinopril  10 mg po daily  6. Obesity - the patient is already involved in an exercise program   7. Smoking cessation - counselling provided  Signed, Lars Masson MD

## 2013-05-08 NOTE — Care Management Note (Addendum)
    Page 1 of 1   05/11/2013     2:38:03 PM   CARE MANAGEMENT NOTE 05/11/2013  Patient:  Adam Lynn, Adam Lynn   Account Number:  [id number]  Date Initiated:  05/08/2013  Documentation initiated by:  MAYO,HENRIETTA  Subjective/Objective Assessment:   adm with dx of AFlutter; lives in group home     Action/Plan:   Cardioversion/ return home with self care and [facility] scale   Anticipated DC Date:  05/11/2013   Anticipated DC Plan:  HOME/SELF CARE  In-house referral  Clinical Social Worker      DC Associate Professor  CM consult  Medication Assistance  [facility] Program Scales      Choice offered to / List presented to:             Status of service:   Medicare Important Message given?   (If response is "NO", the following Medicare IM given date fields will be blank) Date Medicare IM given:   Date Additional Medicare IM given:    Discharge Disposition:    Per UR Regulation:  Reviewed for med. necessity/level of care/duration of stay  If discussed at Long Length of Stay Meetings, dates discussed:    Comments:  05/11/13 [name], RN, BSN, NCM [phone] Spoke to pt at bedside regarding discharge planning.  Pt states he does not require home health services at this time; but could use scale.  NCM provided [facility] scale for pt.  05/08/13 1544 [name] RN MSN BSN CCM Pt to d/c on Xarelto.  Provided card for free 30-day supply.  Pt completed application for pharmaceutical patient assistance program as he is planning to apply for disability and has no insurance.  Form to be completed by physician placed in chart.

## 2013-05-08 NOTE — Clinical Documentation Improvement (Signed)
THIS DOCUMENT IS NOT A PERMANENT PART OF THE MEDICAL RECORD  Please update your documentation with the medical record to reflect your response to this query. If you need help knowing how to do this please call [phone].  05/08/13  Dear Dr. [facility],  In a better effort to capture your patient's severity of illness, reflect appropriate length of stay and utilization of resources, a review of the patient medical record has revealed the following indicators the diagnosis of Heart Failure.    Based on your clinical judgment, please clarify and document in a progress note and/or discharge summary the clinical condition associated with the following supporting information:  In responding to this query please exercise your independent judgment.  The fact that a query is asked, does not imply that any particular answer is desired or expected.    Possible Clinical Conditions?  Chronic Systolic Congestive Heart Failure Chronic Diastolic Congestive Heart Failure Chronic Systolic & Diastolic Congestive Heart Failure Acute Systolic Congestive Heart Failure Acute Diastolic Congestive Heart Failure Acute Systolic & Diastolic Congestive Heart Failure Acute on Chronic Systolic Congestive Heart Failure Acute on Chronic Diastolic Congestive Heart Failure Acute on Chronic Systolic & Diastolic  Congestive Heart Failure Other Condition Cannot Clinically Determine    Risk Factors: CHF might be tachycardia induced, negative 0.7L since yesterday, continue IV Lasix, still fluid overloaded noted per 10/17 progress notes.   Reviewed: additional documentation in the medical record  Thank Patsy Lager,  Clinical Documentation Specialist: [phone] Health Information Management   Corrected version:  Acute systolic CHF - possibly tachycardia induced - negative 0.7 L since yesterday, continue iv Lasix, still fluid overloaded. The patient will be most  probably ready to go home tomorrow or Sunday.

## 2013-05-08 NOTE — Progress Notes (Signed)
ANTICOAGULATION CONSULT NOTE - Initial Consult  Pharmacy Consult for xarelto Indication: atrial fibrillation  Allergies  Allergen Reactions  . Penicillins Rash    Patient Measurements: Height: 6\' 4"  (193 cm) Weight: 334 lb 7 oz (151.7 kg) IBW/kg (Calculated) : 86.8  Vital Signs: Temp: 98.5 F (36.9 C) (10/17 0759) Temp src: Oral (10/17 0759) BP: 126/86 mmHg (10/17 0759) Pulse Rate: 98 (10/17 0759)  Labs:  Recent Labs  05/06/13 1313 05/07/13 1125 05/08/13 0416  HGB 13.0  --   --   HCT 39.6  --   --   PLT 157  --   --   CREATININE 0.95 0.92 1.04    Estimated Creatinine Clearance: 126.5 ml/min (by C-G formula based on Cr of 1.04).   Medical History: Past Medical History  Diagnosis Date  . Medical history non-contributory   . Anginal pain   . Hypertension   . Dysrhythmia   . Anxiety   . Depression   . Shortness of breath   . Sleep apnea   . Diabetes mellitus without complication   . GERD (gastroesophageal reflux disease)   . Headache(784.0)     Medications:  Prescriptions prior to admission  Medication Sig Dispense Refill  . aspirin EC 81 MG tablet Take 81 mg by mouth daily.      Marland Kitchen guaiFENesin (MUCINEX) 600 MG 12 hr tablet Take 600 mg by mouth 2 (two) times daily.      . hydrochlorothiazide (HYDRODIURIL) 25 MG tablet Take 25 mg by mouth daily.      . metFORMIN (GLUCOPHAGE) 500 MG tablet Take 500 mg by mouth daily with breakfast.      . quinapril (ACCUPRIL) 10 MG tablet Take 10 mg by mouth at bedtime.      . sitaGLIPtin (JANUVIA) 50 MG tablet Take 50 mg by mouth daily.        Assessment: 57 y.o.m admitted with new Afib s/p TEE with successful cardioversion. EF 35-40%. CrCl 126 mL/min. Starting xarelto. Care management consulted to for xarelto cost.   Goal of Therapy:  Stroke prevention  Monitor platelets by anticoagulation protocol: Yes   Plan:  Start Xarelto 20 mg once daily with evening meal  Monitor patient for signs and symptoms of bleeding   Educate patient on Xarelto PTA   Vinnie Level, PharmD.  [place] License #[id number] Application for [facility] reciprocity pending  Clinical Pharmacist Pager [phone]   Thank you for allowing pharmacy to be a part of this patients care team.  Lovenia Kim Pharm.D., BCPS Clinical Pharmacist 05/08/2013 12:35 PM Pager: [phone] Phone: [phone]

## 2013-05-09 DIAGNOSIS — Z7901 Long term (current) use of anticoagulants: Secondary | ICD-10-CM

## 2013-05-09 DIAGNOSIS — E119 Type 2 diabetes mellitus without complications: Secondary | ICD-10-CM | POA: Diagnosis present

## 2013-05-09 DIAGNOSIS — I4891 Unspecified atrial fibrillation: Secondary | ICD-10-CM | POA: Diagnosis present

## 2013-05-09 LAB — GLUCOSE, CAPILLARY
Glucose-Capillary: 148 mg/dL — ABNORMAL HIGH (ref 70–99)
Glucose-Capillary: 101 mg/dL — ABNORMAL HIGH (ref 70–99)
Glucose-Capillary: 105 mg/dL — ABNORMAL HIGH (ref 70–99)
Glucose-Capillary: 115 mg/dL — ABNORMAL HIGH (ref 70–99)

## 2013-05-09 LAB — BASIC METABOLIC PANEL
BUN: 14 mg/dL (ref 6–23)
CO2: 25 mEq/L (ref 19–32)
Calcium: 9.2 mg/dL (ref 8.4–10.5)
Chloride: 102 mEq/L (ref 96–112)
Creatinine, Ser: 1.05 mg/dL (ref 0.50–1.35)
Glucose, Bld: 126 mg/dL — ABNORMAL HIGH (ref 70–99)

## 2013-05-09 MED ORDER — FUROSEMIDE 10 MG/ML IJ SOLN
40.0000 mg | Freq: Two times a day (BID) | INTRAMUSCULAR | Status: DC
  Administered 2013-05-09 – 2013-05-10 (×2): 40 mg via INTRAVENOUS
  Filled 2013-05-09 (×3): qty 4

## 2013-05-09 MED ORDER — LOSARTAN POTASSIUM 50 MG PO TABS
50.0000 mg | ORAL_TABLET | Freq: Every day | ORAL | Status: DC
  Administered 2013-05-09 – 2013-05-10 (×2): 50 mg via ORAL
  Filled 2013-05-09 (×3): qty 1

## 2013-05-09 MED ORDER — SPIRONOLACTONE 25 MG PO TABS
25.0000 mg | ORAL_TABLET | Freq: Every day | ORAL | Status: DC
  Administered 2013-05-09 – 2013-05-12 (×4): 25 mg via ORAL
  Filled 2013-05-09 (×4): qty 1

## 2013-05-09 MED ORDER — HYDRALAZINE HCL 25 MG PO TABS
25.0000 mg | ORAL_TABLET | Freq: Three times a day (TID) | ORAL | Status: DC
  Administered 2013-05-09 – 2013-05-12 (×10): 25 mg via ORAL
  Filled 2013-05-09 (×13): qty 1

## 2013-05-09 NOTE — Progress Notes (Signed)
Subjective:  He complains of a cough at night maybe lisinopril induced. He denies chest tightness. Breathing appears to be better but is still dyspneic with most any activity.  Objective:  Vital Signs in the last 24 hours: BP 151/117  Pulse 98  Temp(Src) 98.6 F (37 C) (Oral)  Resp 18  Ht 6\' 4"  (1.93 m)  Wt 151.592 kg (334 lb 3.2 oz)  BMI 40.7 kg/m2  SpO2 98%  Physical Exam: Obese black male currently in no acute distress, mildly dyspneic with talking  Lungs:  Reduced breath sounds at the bases  Cardiac:  Regular rhythm, normal S1 and S2, no S3 Abdomen:  Soft, nontender, no masses Extremities:  2+ edema present  Intake/Output from previous day: 10/17 0701 - 10/18 0700 In: 1355 [P.O.:1320; I.V.:35] Out: 925 [Urine:925] Weight Filed Weights   05/06/13 2000 05/08/13 1550 05/09/13 0558  Weight: 151.7 kg (334 lb 7 oz) 153.044 kg (337 lb 6.4 oz) 151.592 kg (334 lb 3.2 oz)    Lab Results: Basic Metabolic Panel:  Recent Labs  52/84/13 0416 05/09/13 0430  NA 140 138  K 3.7 3.9  CL 102 102  CO2 28 25  GLUCOSE 138* 126*  BUN 15 14  CREATININE 1.04 1.05    CBC:  Recent Labs  05/06/13 1313  WBC 9.1  HGB 13.0  HCT 39.6  MCV 81.8  PLT 157    BNP    Component Value Date/Time   PROBNP 864.6* 05/06/2013 1313    Telemetry: Currently in normal sinus rhythm following cardioversion a few days ago  Assessment/Plan: 1. Acute systolic congestive heart failure which is improving 2. 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 Atrial flutter currently in sinus rhythm following cardioversion 4. Type 2 diabetes mellitus 5. Morbid obesity 6. Nocturnal cough that may be due to ACE inhibition  Recommendations:  He is still edematous and dyspneic on exertion. I think that he will need additional time in the hospital for diuresis prior to discharging home especially in light of his social situation. I would stop his ACE inhibitor and start hydralazine. May need to change  losartan.  Darden Palmer  MD [facility]  05/09/2013, 10:50 AM

## 2013-05-09 NOTE — Progress Notes (Signed)
Occasional cough noted, client sniffing and then coughing.  Oxygen in use.

## 2013-05-10 ENCOUNTER — Encounter (HOSPITAL_COMMUNITY): Payer: Self-pay | Admitting: Cardiology

## 2013-05-10 DIAGNOSIS — I5023 Acute on chronic systolic (congestive) heart failure: Secondary | ICD-10-CM

## 2013-05-10 HISTORY — DX: Acute on chronic systolic (congestive) heart failure: I50.23

## 2013-05-10 LAB — GLUCOSE, CAPILLARY
Glucose-Capillary: 186 mg/dL — ABNORMAL HIGH (ref 70–99)
Glucose-Capillary: 97 mg/dL (ref 70–99)
Glucose-Capillary: 102 mg/dL — ABNORMAL HIGH (ref 70–99)
Glucose-Capillary: 112 mg/dL — ABNORMAL HIGH (ref 70–99)

## 2013-05-10 LAB — BASIC METABOLIC PANEL
CO2: 27 mEq/L (ref 19–32)
Chloride: 102 mEq/L (ref 96–112)
Glucose, Bld: 140 mg/dL — ABNORMAL HIGH (ref 70–99)
Potassium: 3.9 mEq/L (ref 3.5–5.1)
Sodium: 141 mEq/L (ref 135–145)

## 2013-05-10 MED ORDER — FUROSEMIDE 40 MG PO TABS
40.0000 mg | ORAL_TABLET | Freq: Two times a day (BID) | ORAL | Status: DC
  Administered 2013-05-10 – 2013-05-12 (×4): 40 mg via ORAL
  Filled 2013-05-10 (×6): qty 1

## 2013-05-10 NOTE — Progress Notes (Signed)
Subjective:  Breathing and coughing are much better. He has lost additional weight but is still edematous. He denies chest pain. He is quite anxious about his future and wants significant education. Denies PND or orthopnea now.  Objective:  Vital Signs in the last 24 hours: BP 143/81  Pulse 101  Temp(Src) 98.2 F (36.8 C) (Oral)  Resp 18  Ht 6\' 4"  (1.93 m)  Wt 145.786 kg (321 lb 6.4 oz)  BMI 39.14 kg/m2  SpO2 99%  Physical Exam: Obese black male currently in no acute distress  Lungs:  Clear  Cardiac:  Regular rhythm, normal S1 and S2, no S3 Abdomen:  Soft, nontender, no masses Extremities:  1+ edema present  Intake/Output from previous day: 10/18 0701 - 10/19 0700 In: 1520 [P.O.:1520] Out: 7200 [Urine:7200] Weight Filed Weights   05/08/13 1550 05/09/13 0558 05/10/13 0556  Weight: 153.044 kg (337 lb 6.4 oz) 151.592 kg (334 lb 3.2 oz) 145.786 kg (321 lb 6.4 oz)    Lab Results: Basic Metabolic Panel:  Recent Labs  16/10/96 0430 05/10/13 0554  NA 138 141  K 3.9 3.9  CL 102 102  CO2 25 27  GLUCOSE 126* 140*  BUN 14 14  CREATININE 1.05 1.03   BNP    Component Value Date/Time   PROBNP 864.6* 05/06/2013 1313    Telemetry: Currently in normal sinus rhythm following cardioversion a few days ago  Assessment/Plan: 1. Acute systolic congestive heart failure which is improving 2. 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 Atrial flutter currently in sinus rhythm following cardioversion 4. Type 2 diabetes mellitus 5. Morbid obesity 6. Nocturnal cough that may be due to ACE inhibition  Recommendations:  Go ahead and changed to oral diuretics and watch 1 more day. Cardiac rehabilitation to see for education. Hopefully he'll be able to go home soon.  Adam Palmer  MD [facility]  05/10/2013, 11:17 AM

## 2013-05-10 NOTE — Progress Notes (Addendum)
Client in bed at this time.  Has loss weight since yesterday in fluid.  1546 HF booklet provided for patient.

## 2013-05-11 DIAGNOSIS — I5023 Acute on chronic systolic (congestive) heart failure: Secondary | ICD-10-CM

## 2013-05-11 DIAGNOSIS — E119 Type 2 diabetes mellitus without complications: Secondary | ICD-10-CM

## 2013-05-11 LAB — GLUCOSE, CAPILLARY
Glucose-Capillary: 143 mg/dL — ABNORMAL HIGH (ref 70–99)
Glucose-Capillary: 128 mg/dL — ABNORMAL HIGH (ref 70–99)
Glucose-Capillary: 168 mg/dL — ABNORMAL HIGH (ref 70–99)
Glucose-Capillary: 94 mg/dL (ref 70–99)

## 2013-05-11 LAB — BASIC METABOLIC PANEL
CO2: 27 mEq/L (ref 19–32)
Chloride: 102 mEq/L (ref 96–112)
Potassium: 4 mEq/L (ref 3.5–5.1)
Sodium: 139 mEq/L (ref 135–145)

## 2013-05-11 MED ORDER — LISINOPRIL 40 MG PO TABS
40.0000 mg | ORAL_TABLET | Freq: Every day | ORAL | Status: DC
  Administered 2013-05-11 – 2013-05-12 (×2): 40 mg via ORAL
  Filled 2013-05-11 (×2): qty 1

## 2013-05-11 NOTE — Progress Notes (Signed)
ANTICOAGULATION CONSULT NOTE:  FOLLOW-UP  Pharmacy Consult:  Xarelto Indication: atrial fibrillation  Allergies  Allergen Reactions  . Penicillins Rash    Patient Measurements: Height: 6\' 4"  (193 cm) Weight: 317 lb (143.79 kg) (scale a) IBW/kg (Calculated) : 86.8  Vital Signs: Temp: 98.1 F (36.7 C) (10/20 0500) Temp src: Oral (10/20 0500) BP: 144/96 mmHg (10/20 0500) Pulse Rate: 88 (10/20 0500)  Labs:  Recent Labs  05/09/13 0430 05/10/13 0554 05/11/13 0533  CREATININE 1.05 1.03 1.04    Estimated Creatinine Clearance: 122.9 ml/min (by C-G formula based on Cr of 1.04).      Assessment: 57 y.o.m admitted with new Afib/Aflutter s/p TEE with successful cardioversion.  Patient continues on Xarelto therapy with plan to discharge tomorrow.  Last CBC on 05/06/13 was WNL.  No bleeding reported and patient's renal function remains stable.   Goal of Therapy:  Stroke prevention  Monitor platelets by anticoagulation protocol: Yes    Plan:  - Continue Xarelto 20 mg once daily with evening meal  - Education provided 05/08/13 - Recommend PRN CBC / CMET - Pharmacy will sign off as dosage adjustment likely unnecessary.  Thank you for the consult!     Jamieon Lannen D. Laney Potash, PharmD, BCPS Pager:  [phone] 05/11/2013, 9:39 AM

## 2013-05-11 NOTE — Progress Notes (Signed)
Subjective:  Denies CP or SOB.  The patient feels anxious about his condition and ability to work. He hasn't walk yet.   Objective:  Vital Signs in the last 24 hours: BP 144/96  Pulse 88  Temp(Src) 98.1 F (36.7 C) (Oral)  Resp 20  Ht 6\' 4"  (1.93 m)  Wt 317 lb (143.79 kg)  BMI 38.6 kg/m2  SpO2 97%  Physical Exam: Obese black male currently in no acute distress  Lungs:  Clear  Cardiac:  Regular rhythm, normal S1 and S2, no S3 Abdomen:  Soft, nontender, no masses Extremities:  1+ edema present  Intake/Output from previous day: 10/19 0701 - 10/20 0700 In: 1370 [P.O.:1370] Out: 2500 [Urine:2500]  Weight Filed Weights   05/09/13 0558 05/10/13 0556 05/11/13 0500  Weight: 334 lb 3.2 oz (151.592 kg) 321 lb 6.4 oz (145.786 kg) 317 lb (143.79 kg)    Lab Results: Basic Metabolic Panel:  Recent Labs  16/10/96 0554 05/11/13 0533  NA 141 139  K 3.9 4.0  CL 102 102  CO2 27 27  GLUCOSE 140* 129*  BUN 14 13  CREATININE 1.03 1.04   BNP    Component Value Date/Time   PROBNP 864.6* 05/06/2013 1313   Telemetry: Currently in normal sinus rhythm following cardioversion a few days ago    Assessment/Plan: 1. Acute systolic congestive heart failure which is improving 2. 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 Atrial flutter currently in sinus rhythm following cardioversion 4. Type 2 diabetes mellitus 5. Morbid obesity 6. Nocturnal cough that may be due to ACE inhibition  Recommendations:  Mobilization today. One more day of PO diuretics in hospital to see if any worsening of symptoms. Discharge tomorrow with all generics medications ( no insurance). Free supply of Xarelto for 1 month from the clinic. He doesn't want rehabilitation as he is concerned about the cost. He will need note for work that allows him sedentary workload for a limited time.   Tobias Alexander, Rexene Edison, MD  05/11/2013, 7:59 AM

## 2013-05-11 NOTE — Progress Notes (Signed)
1325-1430 Pt just walked 920 ft with NT and sats maintained above 90% on RA. Reviewed CHF booklet with pt including the zones and when to call MD. Pt does not have scales at home. Notified case manager to see if pt qualifies for free scales. Pt knows when to notify MD with weight gain, 2000mg  sodium restriction, and written ex ed. Discussed smoking cessation and handouts given. Encouraged pt to call 1800quitnow as needed for counseling. Pt states he has watched CHF video. Pt is worried about working at [employer] and trying to quit smoking. He said he may talk to someone about putting him in area where smoking not allowed. Duanne Limerick  RNBSN 05/11/2013 2:28 PM

## 2013-05-11 NOTE — Progress Notes (Signed)
Pt a/o, no c/o pain, pt oob and walked in hallway with no c/o SOB O2 WNL, pt states he feels better.  He is worried about being on lasix and going back to work

## 2013-05-12 ENCOUNTER — Encounter (HOSPITAL_COMMUNITY): Payer: Self-pay | Admitting: Physician Assistant

## 2013-05-12 DIAGNOSIS — I4891 Unspecified atrial fibrillation: Secondary | ICD-10-CM

## 2013-05-12 LAB — BASIC METABOLIC PANEL
BUN: 14 mg/dL (ref 6–23)
Calcium: 9.5 mg/dL (ref 8.4–10.5)
Creatinine, Ser: 1.11 mg/dL (ref 0.50–1.35)
GFR calc Af Amer: 84 mL/min — ABNORMAL LOW (ref >90)
GFR calc non Af Amer: 72 mL/min — ABNORMAL LOW (ref >90)

## 2013-05-12 LAB — GLUCOSE, CAPILLARY
Glucose-Capillary: 147 mg/dL — ABNORMAL HIGH (ref 70–99)
Glucose-Capillary: 98 mg/dL (ref 70–99)

## 2013-05-12 MED ORDER — POTASSIUM CHLORIDE CRYS ER 20 MEQ PO TBCR: 20.0000 meq | EXTENDED_RELEASE_TABLET | Freq: Two times a day (BID) | ORAL | Status: DC

## 2013-05-12 MED ORDER — RIVAROXABAN 20 MG PO TABS: 20.0000 mg | ORAL_TABLET | Freq: Every day | ORAL | Status: DC

## 2013-05-12 MED ORDER — FUROSEMIDE 40 MG PO TABS: 40.0000 mg | ORAL_TABLET | Freq: Two times a day (BID) | ORAL | Status: DC

## 2013-05-12 MED ORDER — METOPROLOL SUCCINATE ER 25 MG PO TB24: 25.0000 mg | ORAL_TABLET | Freq: Every day | ORAL | Status: DC

## 2013-05-12 MED ORDER — LISINOPRIL 40 MG PO TABS: 40.0000 mg | ORAL_TABLET | Freq: Every day | ORAL | Status: DC

## 2013-05-12 MED ORDER — HYDRALAZINE HCL 25 MG PO TABS: 25.0000 mg | ORAL_TABLET | Freq: Three times a day (TID) | ORAL | Status: DC

## 2013-05-12 MED ORDER — SPIRONOLACTONE 25 MG PO TABS: 25.0000 mg | ORAL_TABLET | Freq: Every day | ORAL | Status: DC

## 2013-05-12 NOTE — Progress Notes (Signed)
All d/c instructions explained and given to pt by charge nurse Dorann Lodge, RN.

## 2013-05-12 NOTE — Progress Notes (Signed)
PT Cancellation Note  Patient Details Name: Erion Weightman MRN: [id number] DOB: [birth year]   Cancelled Treatment:    Reason Eval/Treat Not Completed: Other (comment) (Pt declines PT. Ambulating in halls independently.  ) Will not evaluate per pt request.  Per pt he ambulates without LOB.  Has been ambulating in halls.   Sign off.   INGOLD,Sherwin Hollingshed 05/12/2013, 8:49 AM [facility] [phone] (pager)

## 2013-05-12 NOTE — Progress Notes (Signed)
The patient did not have any acute changes or complaints overnight.  He states that he is feeling better this morning and that he is ready to go home.

## 2013-05-12 NOTE — Discharge Summary (Signed)
Adam Lynn, Adam Lynn 05/12/2013

## 2013-05-12 NOTE — Progress Notes (Signed)
Subjective:  Denies CP or SOB.  The patient feels good, ready to go home. He walked yesterday without difficulties.  Objective:  Vital Signs in the last 24 hours: BP 136/88  Pulse 79  Temp(Src) 98.4 F (36.9 C) (Oral)  Resp 18  Ht 6\' 4"  (1.93 m)  Wt 312 lb 9.8 oz (141.8 kg)  BMI 38.07 kg/m2  SpO2 94%  Physical Exam: AAOx 3, no distress  Lungs:  Clear  Cardiac:  Regular rhythm, normal S1 and S2, no S3 Abdomen:  Soft, nontender, no masses Extremities:  1+ edema present  Intake/Output from previous day: 10/20 0701 - 10/21 0700 In: 1180 [P.O.:1180] Out: 1800 [Urine:1800]  Weight Filed Weights   05/10/13 0556 05/11/13 0500 05/12/13 0625  Weight: 321 lb 6.4 oz (145.786 kg) 317 lb (143.79 kg) 312 lb 9.8 oz (141.8 kg)    Lab Results: Basic Metabolic Panel:  Recent Labs  44/03/47 0533 05/12/13 0540  NA 139 139  K 4.0 4.2  CL 102 102  CO2 27 28  GLUCOSE 129* 127*  BUN 13 14  CREATININE 1.04 1.11   BNP    Component Value Date/Time   PROBNP 864.6* 05/06/2013 1313   Telemetry: Currently in normal sinus rhythm following cardioversion a few days ago    Assessment/Plan: 1. Acute systolic congestive heart failure which is improving 2. 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 Atrial flutter currently in sinus rhythm following cardioversion 4. Type 2 diabetes mellitus 5. Morbid obesity 6. Nocturnal cough that may be due to ACE inhibition  Recommendations:  Discharge today. Free supply of Xarelto for 2 weeks given from the clinic. He doesn't want rehabilitation as he is concerned about the cost. He will need note for work that allows him sedentary workload for a limited time.   Adam Lynn, Rexene Edison, MD  05/12/2013, 7:46 AM

## 2013-05-12 NOTE — Discharge Summary (Signed)
CARDIOLOGY DISCHARGE SUMMARY   Patient ID: Adam Lynn MRN: [id number] DOB/AGE: 1956/07/09 57 y.o.  Admit date: 05/06/2013 Discharge date: 05/12/2013    Primary Discharge Diagnosis: Acute on chronic systolic CHF in the setting of atrial flutter - discharge weight 312 lbs Secondary Discharge Diagnosis:    Hypertensive heart disease   Morbid obesity   Diabetes mellitus type 2, noninsulin dependent    Procedures: TEE, 2-D echocardiogram    Hospital Course:  Mr. Blaisdell is a 57 year old male with a history of poorly controlled hypertension and diabetes, s/p catheterization with 0 stents (several years ago according to patient), ongoing smoker and morbid obesity with recent 50lb intentional weight loss. Patient presented to the ED from urgent care on 05/06/2013 with chest tightness, worsening SOB x 4 days and increasing orthopnea and lower extremity edema. He had no prior history of CHF. He denied palpitations or abnormal heart rhythm as well as nausea and vomiting.    Patient was found to be in atrial flutter with 2:1 conduction and RVR at 142 BPM on 05/06/2013. He was started on Cardizem drip and Heparin. Patient underwent TEE and successful cardioversion into NSR on 05/07/13 and was started on Xarelto 20 mg daily. TEE showed moderately decreased biventricular function which was thought to be tachycardia induced. Cardizem was discontinued and replaced with Toprol XL 25 mg. Patient's potassium was also noted to be on the lower end of normal (K=3.5 ) and was repleted with KCL BID. His potassium has steadily increased to 4.2.  Patient's BNP was 846 and his POC troponin was normal at the time of admission. He was diuresed with IV Lasix with a 22 lb weight reduction (334 lbs on 05/06/13 and 312 lbs on 05/11/13). He was converted to PO Lasix on 10/19. Spironolactone 25mg  and hydralazine 25mg  TID were also added to his heart failure regimen. Patient's diabetes was controlled using sliding  scale insulin while inpatient and he will resume his home medications upon discharge.  Mr. Scheer reports his symptoms as a "negative 5" on a scale of 1-10 today. He has been mobilized and feels ready to go home. He is to be discharged with all generics medications (no insurance) and a free supply of Xarelto (all paperwork completed and instructions given to patient).   Mr. Recupero received extensive education on heart failure. He reviewed the booklet and video and was instructed to watch for signs and symptoms similar to this episode, watch salt intake and weigh daily. Patient doesn't want rehabilitation as he is concerned about the cost. He will be given a note for work that allows him sedentary workload and easy bathroom accessibility for a limited time. 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 He admits it is difficult to make healthy meals without a personal kitchen and achieve smoking cessation while working at [employer], but he is determined to change his circumstances in order to "get healthy."  He would like to discuss options for smoking cessation at his follow up appointment as well as see a dietician for further assistance.    Labs:   Lab Results  Component Value Date   WBC 9.1 05/06/2013   HGB 13.0 05/06/2013   HCT 39.6 05/06/2013   MCV 81.8 05/06/2013   PLT 157 05/06/2013     Recent Labs Lab 05/12/13 0540  NA 139  K 4.2  CL 102  CO2 28  BUN 14  CREATININE 1.11  CALCIUM 9.5  GLUCOSE 127*    Pro B Natriuretic peptide (BNP)  Date/Time Value Range Status  05/06/2013  1:13 PM 864.6* 0 - 125 pg/mL Final    Radiology: Dg Chest 2 View 05/06/2013   CLINICAL DATA:  Productive cough with chest tightness for 4 days. History of diabetes and hypertension.  EXAM: CHEST  2 VIEW  COMPARISON:  None.  FINDINGS: The heart is mildly enlarged. There is vascular congestion with fissural thickening and small bilateral pleural effusions suspicious for  mild congestive heart failure. No confluent airspace opacity is identified. There are degenerative changes throughout the spine.  IMPRESSION: Cardiomegaly with interstitial edema and small pleural effusions suspicious for mild congestive heart failure.   Electronically Signed   By: Roxy Horseman M.D.   On: 05/06/2013 11:38    Cardiac Cath: None  EKG: Atrial flutter with 2:1 conduction on 10/15  Echo:  TEE 05/07/13 Study Conclusions Left Ventrical: At least moderate LV dysfunction, LV EF 30%  Mitral Valve: thickened, 2 small jets of MR, overall MR mild  Aortic Valve: Normal, trileaflet, no AI  Tricuspid Valve: Normal, mild TR  Pulmonic Valve: Normal, trace PR  Left Atrium/ Left atrial appendage: Dilated LA, no thrombus in the LA or LAA, present smoke in LA and LAA, mildly decreased filling and emptying velocities 30 cm/sec  Atrial septum: no PFO  Aorta: mild atherosclerosis Pulmonic valve: No evidence of vegetation.  Transthoracic ECHO 05/08/13 Impressions: - Mildly dilated LV with moderate LV hypertrophy. EF 35% with diffuse hypokinesis. Moderate diastolic dysfunction. Mildly dilated RV with moderately decreased systolic function. Mild pulmonary hypertension.    FOLLOW UP PLANS AND APPOINTMENTS Allergies  Allergen Reactions  . Penicillins Rash     Medication List    STOP taking these medications       hydrochlorothiazide 25 MG tablet  Commonly known as:  HYDRODIURIL     quinapril 10 MG tablet  Commonly known as:  ACCUPRIL      TAKE these medications       aspirin EC 81 MG tablet  Take 81 mg by mouth daily.     furosemide 40 MG tablet  Commonly known as:  LASIX  Take 1 tablet (40 mg total) by mouth 2 (two) times daily.     guaiFENesin 600 MG 12 hr tablet  Commonly known as:  MUCINEX  Take 600 mg by mouth 2 (two) times daily.     hydrALAZINE 25 MG tablet  Commonly known as:  APRESOLINE  Take 1 tablet (25 mg total) by mouth 3 (three) times daily with meals.      lisinopril 40 MG tablet  Commonly known as:  PRINIVIL,ZESTRIL  Take 1 tablet (40 mg total) by mouth daily.     metFORMIN 500 MG tablet  Commonly known as:  GLUCOPHAGE  Take 500 mg by mouth daily with breakfast.     metoprolol succinate 25 MG 24 hr tablet  Commonly known as:  TOPROL-XL  Take 1 tablet (25 mg total) by mouth daily.     potassium chloride SA 20 MEQ tablet  Commonly known as:  K-DUR,KLOR-CON  Take 1 tablet (20 mEq total) by mouth 2 (two) times daily.     Rivaroxaban 20 MG Tabs tablet  Commonly known as:  XARELTO  Take 1 tablet (20 mg total) by mouth daily.     sitaGLIPtin 50 MG tablet  Commonly known as:  JANUVIA  Take 50 mg by mouth daily.     spironolactone 25 MG tablet  Commonly  known as:  ALDACTONE  Take 1 tablet (25 mg total) by mouth daily.          Follow-up Information   Follow up with Lars Masson, MD On 05/27/2013. (appointment at 1:45pm)    Specialty:  Cardiology   Contact information:   [address] [place] [address] [phone]       BRING ALL MEDICATIONS WITH YOU TO FOLLOW UP APPOINTMENTS  Time spent with patient to include physician time: Signed: Theodore Demark, PA-C 05/12/2013, 1:37 PM Co-Sign MD

## 2013-05-19 ENCOUNTER — Ambulatory Visit: Payer: Medicare Other | Attending: Internal Medicine

## 2013-05-19 ENCOUNTER — Ambulatory Visit: Payer: Self-pay | Attending: Internal Medicine | Admitting: Internal Medicine

## 2013-05-19 VITALS — BP 139/94 | HR 72 | Temp 98.3°F | Resp 16

## 2013-05-19 DIAGNOSIS — E119 Type 2 diabetes mellitus without complications: Secondary | ICD-10-CM | POA: Insufficient documentation

## 2013-05-19 DIAGNOSIS — E1110 Type 2 diabetes mellitus with ketoacidosis without coma: Secondary | ICD-10-CM

## 2013-05-19 DIAGNOSIS — E1310 Other specified diabetes mellitus with ketoacidosis without coma: Secondary | ICD-10-CM

## 2013-05-19 DIAGNOSIS — I4891 Unspecified atrial fibrillation: Secondary | ICD-10-CM | POA: Insufficient documentation

## 2013-05-19 DIAGNOSIS — Z7901 Long term (current) use of anticoagulants: Secondary | ICD-10-CM | POA: Insufficient documentation

## 2013-05-19 DIAGNOSIS — I509 Heart failure, unspecified: Secondary | ICD-10-CM | POA: Insufficient documentation

## 2013-05-19 DIAGNOSIS — I5023 Acute on chronic systolic (congestive) heart failure: Secondary | ICD-10-CM | POA: Insufficient documentation

## 2013-05-19 LAB — GLUCOSE, POCT (MANUAL RESULT ENTRY): POC Glucose: 170 mg/dl — AB (ref 70–99)

## 2013-05-19 LAB — POCT GLYCOSYLATED HEMOGLOBIN (HGB A1C): Hemoglobin A1C: 7.3

## 2013-05-19 MED ORDER — POTASSIUM CHLORIDE CRYS ER 20 MEQ PO TBCR: 20.0000 meq | EXTENDED_RELEASE_TABLET | Freq: Two times a day (BID) | ORAL | Status: DC

## 2013-05-19 MED ORDER — RIVAROXABAN 20 MG PO TABS: 20.0000 mg | ORAL_TABLET | Freq: Every day | ORAL | Status: DC

## 2013-05-19 NOTE — Progress Notes (Signed)
Patient here for hospital follow up History of DM CHF HTN

## 2013-05-19 NOTE — Patient Instructions (Signed)

## 2013-05-19 NOTE — Progress Notes (Signed)
Patient ID: Adam Lynn, male   DOB: [birth year], 57 y.o.   MRN: [id number]  CC: Followup  HPI: Patient is 57 year old male with atrial flutter, hypertension acute on chronic systolic congestive heart failure, diabetes mellitus, presents to clinic for followup and would like prescription for Zaroxolyn and potassium. He reports checking weight regularly and currently is at 315 pounds. He explains this is much better than his recent hospital stay where he was 335 pounds. He denies chest pain or shortness of breath, no abdominal or urinary concerns. He reports compliance with all medicines.  Allergies  Allergen Reactions  . Penicillins Rash   Past Medical History  Diagnosis Date  . Hypertension   . Atrial flutter with rapid ventricular response   . Acute on chronic systolic congestive heart failure     EF: 35% on 05/10/13  . Shortness of breath   . Sleep apnea   . Diabetes mellitus   . GERD (gastroesophageal reflux disease)   . Acute on chronic systolic congestive heart failure 05/10/2013  . Depression   . Anxiety    Current Outpatient Prescriptions on File Prior to Visit  Medication Sig Dispense Refill  . aspirin EC 81 MG tablet Take 81 mg by mouth daily.      . furosemide (LASIX) 40 MG tablet Take 1 tablet (40 mg total) by mouth 2 (two) times daily.  60 tablet  3  . guaiFENesin (MUCINEX) 600 MG 12 hr tablet Take 600 mg by mouth 2 (two) times daily.      . hydrALAZINE (APRESOLINE) 25 MG tablet Take 1 tablet (25 mg total) by mouth 3 (three) times daily with meals.  90 tablet  3  . lisinopril (PRINIVIL,ZESTRIL) 40 MG tablet Take 1 tablet (40 mg total) by mouth daily.  30 tablet  3  . metFORMIN (GLUCOPHAGE) 500 MG tablet Take 500 mg by mouth daily with breakfast.      . metoprolol succinate (TOPROL-XL) 25 MG 24 hr tablet Take 1 tablet (25 mg total) by mouth daily.  30 tablet  3  . sitaGLIPtin (JANUVIA) 50 MG tablet Take 50 mg by mouth daily.      Marland Kitchen spironolactone (ALDACTONE) 25 MG tablet  Take 1 tablet (25 mg total) by mouth daily.  30 tablet  3   No current facility-administered medications on file prior to visit.   Family history of high blood pressure History   Social History  . Marital Status: Single    Spouse Name: N/A    Number of Children: N/A  . Years of Education: N/A   Occupational History  . Not on file.   Social History Main Topics  . Smoking status: Former Smoker -- 0.50 packs/day    Types: Cigarettes  . Smokeless tobacco: Never Used  . Alcohol Use: No  . Drug Use: No  . Sexual Activity: Yes   Other Topics Concern  . Not on file   Social History Narrative  . No narrative on file    Review of Systems  Constitutional: Negative for fever, chills, diaphoresis, activity change, appetite change and fatigue.  HENT: Negative for ear pain, nosebleeds, congestion, facial swelling, rhinorrhea, neck pain, neck stiffness and ear discharge.   Eyes: Negative for pain, discharge, redness, itching and visual disturbance.  Respiratory: Negative for cough, choking, chest tightness, shortness of breath, wheezing and stridor.   Cardiovascular: Negative for chest pain, palpitations and leg swelling.  Gastrointestinal: Negative for abdominal distention.  Genitourinary: Negative for dysuria, urgency, frequency, hematuria, flank pain,  decreased urine volume, difficulty urinating and dyspareunia.  Musculoskeletal: Negative for back pain, joint swelling, arthralgias and gait problem.  Neurological: Negative for dizziness, tremors, seizures, syncope, facial asymmetry, speech difficulty, weakness, light-headedness, numbness and headaches.  Hematological: Negative for adenopathy. Does not bruise/bleed easily.  Psychiatric/Behavioral: Negative for hallucinations, behavioral problems, confusion, dysphoric mood, decreased concentration and agitation.    Objective:   Filed Vitals:   05/19/13 0921  BP: 139/94  Pulse: 72  Temp: 98.3 F (36.8 C)  Resp: 16    Physical  Exam  Constitutional: Appears well-developed and well-nourished. No distress.  CVS: RRR, S1/S2 +, no murmurs, no gallops, no carotid bruit.  Pulmonary: Effort and breath sounds normal, no stridor, rhonchi, wheezes, rales.  Abdominal: Soft. BS +,  no distension, tenderness, rebound or guarding.  Musculoskeletal: Normal range of motion. No edema and no tenderness.    Lab Results  Component Value Date   WBC 9.1 05/06/2013   HGB 13.0 05/06/2013   HCT 39.6 05/06/2013   MCV 81.8 05/06/2013   PLT 157 05/06/2013   Lab Results  Component Value Date   CREATININE 1.11 05/12/2013   BUN 14 05/12/2013   NA 139 05/12/2013   K 4.2 05/12/2013   CL 102 05/12/2013   CO2 28 05/12/2013    Lab Results  Component Value Date   HGBA1C 7.3 05/19/2013   Lipid Panel  No results found for this basename: chol, trig, hdl, cholhdl, vldl, ldlcalc       Assessment and plan:   Patient Active Problem List   Diagnosis Date Noted  . Acute on chronic systolic congestive heart failure - appears to be clinically compensated well, continue current regimen, provide refill on potassium  05/10/2013  . Diabetes mellitus type 2, noninsulin dependent - continue current regimen, A1C indicative of good control of diabetes   05/09/2013  . Atrial fibrillation - rate controlled, we'll provide samples of Xarelto, will also set up with social worker so that patient can enroll in assistance program  05/09/2013  . Long-term (current) use of anticoagulants - provided free samples of Xarelto  05/09/2013  . Hypertensive heart disease - reasonably well controlled, advised to monitor regularly  05/07/2013

## 2013-05-27 ENCOUNTER — Encounter: Payer: Self-pay | Admitting: Cardiology

## 2013-05-27 ENCOUNTER — Ambulatory Visit (INDEPENDENT_AMBULATORY_CARE_PROVIDER_SITE_OTHER): Payer: Self-pay | Admitting: Cardiology

## 2013-05-27 VITALS — BP 126/78 | HR 86 | Ht 76.0 in | Wt 318.0 lb

## 2013-05-27 DIAGNOSIS — I5023 Acute on chronic systolic (congestive) heart failure: Secondary | ICD-10-CM

## 2013-05-27 DIAGNOSIS — I5021 Acute systolic (congestive) heart failure: Secondary | ICD-10-CM

## 2013-05-27 DIAGNOSIS — I5031 Acute diastolic (congestive) heart failure: Secondary | ICD-10-CM

## 2013-05-27 DIAGNOSIS — I509 Heart failure, unspecified: Secondary | ICD-10-CM

## 2013-05-27 MED ORDER — ALPRAZOLAM 0.5 MG PO TABS: 0.5000 mg | ORAL_TABLET | Freq: Every evening | ORAL | Status: DC | PRN

## 2013-05-27 NOTE — Patient Instructions (Addendum)
Your physician has requested that you have an echocardiogram. Echocardiography is a painless test that uses sound waves to create images of your heart. It provides your doctor with information about the size and shape of your heart and how well your heart's chambers and valves are working. This procedure takes approximately one hour. There are no restrictions for this procedure.  Your physician has recommended you make the following change in your medication: start taking Xanax 0.5 mg as needed for anxiety and depression. Please follow up with your primary care MD for refills  Your physician recommends that you schedule a follow-up appointment in: 6 weeks or after Echo

## 2013-05-27 NOTE — Progress Notes (Signed)
Patient ID: Adam Lynn, male   DOB: Apr 15, 1956, 57 y.o.   MRN: [id number]    Patient Name: Adam Lynn Date of Encounter: 05/27/2013  Primary Care Provider:  Jeanann Lewandowsky, MD Primary Cardiologist:  Tobias Alexander, H   Patient Profile  Follow up after hospital visit  Problem List   Past Medical History  Diagnosis Date  . Hypertension   . Atrial flutter with rapid ventricular response   . Acute on chronic systolic congestive heart failure     EF: 35% on 05/10/13  . Shortness of breath   . Sleep apnea   . Diabetes mellitus   . GERD (gastroesophageal reflux disease)   . Acute on chronic systolic congestive heart failure 05/10/2013  . Depression   . Anxiety    Past Surgical History  Procedure Laterality Date  . Cardiac catheterization    . Tonsillectomy    . Tee without cardioversion Left 05/07/2013    Procedure: TRANSESOPHAGEAL ECHOCARDIOGRAM (TEE);  Surgeon: Lars Masson, MD;  Location: [facility] ENDOSCOPY;  Service: Cardiovascular;  Laterality: Left;  . Cardioversion Left 05/07/2013    Procedure: CARDIOVERSION;  Surgeon: Lars Masson, MD;  Location[facility] ENDOSCOPY;  Service: Cardiovascular;  Laterality: Left;    Allergies  Allergies  Allergen Reactions  . Penicillins Rash    HPI  57 year old male with a history of diabetes, hypertension, who was recently hospitalized with acute combined systolic and diastolic heart failure with LV ejection fraction 30-35%. The patient was found to be in atrial flutter with 2 to one block, he was cardioverted back to the sinus rhythm and was treated for significant fluid overload. The patient responded to diuretics very well and was started on ACE inhibitor, beta blocker, as well as the spironolactone.  Echocardiogram showed mildly dilated left ventricle with moderately decreased LV function EF 30-35%, left atrium is moderately dilated, right ventricle is dilated with moderately decreased function, and mild pulmonary hypertension.  He also had grades 2 diastolic dysfunction.  The patient is coming for 2 week followup. He denies orthopnea paroxysmal nocturnal dyspnea, or lower extremity edema. He denies palpitations or chest pain. 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 The patient cries during the examination, he feels he is a very difficult situation currently, he feels that his work has been really difficult not paying attention to his medical needs, he doesn't have any family around, and would like to change his accommodation.  Home Medications  Prior to Admission medications   Medication Sig Start Date End Date Taking? Authorizing Provider  aspirin EC 81 MG tablet Take 81 mg by mouth daily.   Yes Historical Provider, MD  furosemide (LASIX) 40 MG tablet Take 1 tablet (40 mg total) by mouth 2 (two) times daily. 05/12/13  Yes Rhonda G Barrett, PA-C  guaiFENesin (MUCINEX) 600 MG 12 hr tablet Take 600 mg by mouth 2 (two) times daily.   Yes Historical Provider, MD  hydrALAZINE (APRESOLINE) 25 MG tablet Take 1 tablet (25 mg total) by mouth 3 (three) times daily with meals. 05/12/13  Yes Rhonda G Barrett, PA-C  lisinopril (PRINIVIL,ZESTRIL) 40 MG tablet Take 1 tablet (40 mg total) by mouth daily. 05/12/13  Yes Rhonda G Barrett, PA-C  metFORMIN (GLUCOPHAGE) 1000 MG tablet Take 1,000 mg by mouth 2 (two) times daily with a meal.   Yes Historical Provider, MD  metoprolol succinate (TOPROL-XL) 25 MG 24 hr tablet Take 25 mg  by mouth every morning. 05/12/13  Yes Rhonda G Barrett, PA-C  potassium chloride SA (K-DUR,KLOR-CON) 20 MEQ tablet Take 1 tablet (20 mEq total) by mouth 2 (two) times daily. 05/19/13  Yes Dorothea Ogle, MD  Rivaroxaban (XARELTO) 20 MG TABS tablet Take 1 tablet (20 mg total) by mouth daily. 05/19/13  Yes Dorothea Ogle, MD  sitaGLIPtin (JANUVIA) 50 MG tablet Take 50 mg by mouth daily.   Yes Historical Provider, MD    spironolactone (ALDACTONE) 25 MG tablet Take 1 tablet (25 mg total) by mouth daily. 05/12/13  Yes Rhonda G Barrett, PA-C    Family History  No family history on file.  Social History  History   Social History  . Marital Status: Single    Spouse Name: N/A    Number of Children: N/A  . Years of Education: N/A   Occupational History  . Not on file.   Social History Main Topics  . Smoking status: Former Smoker -- 0.50 packs/day    Types: Cigarettes  . Smokeless tobacco: Never Used  . Alcohol Use: No  . Drug Use: No  . Sexual Activity: Yes   Other Topics Concern  . Not on file   Social History Narrative  . No narrative on file     Review of Systems, as per history of present illness otherwise negative General:  No chills, fever, night sweats or weight changes.  Cardiovascular:  No chest pain, dyspnea on exertion, edema, orthopnea, palpitations, paroxysmal nocturnal dyspnea. Dermatological: No rash, lesions/masses Respiratory: No cough, dyspnea Urologic: No hematuria, dysuria Abdominal:   No nausea, vomiting, diarrhea, bright red blood per rectum, melena, or hematemesis Neurologic:  No visual changes, wkns, changes in mental status. All other systems reviewed and are otherwise negative except as noted above.  Physical Exam  Blood pressure 126/78, pulse 86, height 6\' 4"  (1.93 m), weight 318 lb (144.244 kg).  General: Pleasant, NAD Psych: Normal affect. Neuro: Alert and oriented X 3. Moves all extremities spontaneously. HEENT: Normal  Neck: Supple without bruits or JVD. Lungs:  Resp regular and unlabored, CTA. Heart: RRR no s3, s4, or murmurs. Abdomen: Soft, non-tender, non-distended, BS + x 4.  Extremities: No clubbing, cyanosis or edema. DP/PT/Radials 2+ and equal bilaterally.  Accessory Clinical Findings  ECG - sinus rhythm, 86 beats per minute, right bundle branch block, left anterior fascicular block, bifascicular block, abnormal EKG.  TTE 05/08/2013 -  Left ventricle: The cavity size was mildly dilated. Wall thickness was increased in a pattern of moderate LVH. The estimated ejection fraction was 35%. Diffuse hypokinesis. Definity contrast was given. Features are consistent with a pseudonormal left ventricular filling pattern, with concomitant abnormal relaxation and increased filling pressure (grade 2 diastolic dysfunction). - Aortic valve: Transvalvular velocity was within the normal range. There was no stenosis. - Aorta: Mildly dilated ascending aorta. Proximal ascending aorta measures 41 mm. Aortic root dimension: 37mm (ED). - Mitral valve: Trivial regurgitation. - Left atrium: The atrium was moderately dilated. - Right ventricle: The cavity size was mildly dilated. Systolic function was moderately reduced. - Right atrium: The atrium was moderately dilated. - Tricuspid valve: Peak RV-RA gradient: 23mm Hg (S). - Pulmonary arteries: PA systolic pressure 34-38 mmHg. - Systemic veins: IVC measured 2.4 cm with < 50% respirophasic variation, suggesting RA pressure 11-15 mmHg. Impressions:  - Mildly dilated LV with moderate LV hypertrophy. EF 35% with diffuse hypokinesis. Moderate diastolic dysfunction. Mildly dilated RV with moderately decreased systolic function. Mild pulmonary hypertension.   Assessment &  Plan  1. Acute systolic congestive heart failure -  The patient appears almost euvolemic we will continue the same regimen with Lasix 40 mg orally daily and spironolactone 25 mg daily  2. 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 Atrial flutter currently in sinus rhythm following cardioversion - patient is still in sinus rhythm, we will continue beta blockers. We might consider discontinuation of Xarelto, if he remains in sinus rhythm 2 months from now  4. Type 2 diabetes  mellitus   5. Morbid obesity - the patient changed his diet and started to exercise slowly and he feels he already lost some weight  6. depression, anxiety - will give patient 30 pills of Xanax 0.5 mg every 8 hours when necessary, the patient is advised about possible development of dependency, there are no refills on those pills distressed to overcome these difficult time 40s patient  Followup in 2 months   Tobias Alexander, Rexene Edison, MD 05/27/2013, 1:59 PM

## 2013-06-09 ENCOUNTER — Other Ambulatory Visit (HOSPITAL_COMMUNITY): Payer: Self-pay

## 2013-06-17 ENCOUNTER — Ambulatory Visit (HOSPITAL_COMMUNITY): Payer: Medicare Other | Attending: Cardiology | Admitting: Radiology

## 2013-06-17 ENCOUNTER — Encounter: Payer: Self-pay | Admitting: Cardiology

## 2013-06-17 DIAGNOSIS — I5021 Acute systolic (congestive) heart failure: Secondary | ICD-10-CM

## 2013-06-17 DIAGNOSIS — I4892 Unspecified atrial flutter: Secondary | ICD-10-CM | POA: Insufficient documentation

## 2013-06-17 DIAGNOSIS — R0989 Other specified symptoms and signs involving the circulatory and respiratory systems: Secondary | ICD-10-CM | POA: Insufficient documentation

## 2013-06-17 DIAGNOSIS — R0602 Shortness of breath: Secondary | ICD-10-CM | POA: Insufficient documentation

## 2013-06-17 DIAGNOSIS — E119 Type 2 diabetes mellitus without complications: Secondary | ICD-10-CM | POA: Insufficient documentation

## 2013-06-17 DIAGNOSIS — Z6838 Body mass index (BMI) 38.0-38.9, adult: Secondary | ICD-10-CM | POA: Insufficient documentation

## 2013-06-17 DIAGNOSIS — G4730 Sleep apnea, unspecified: Secondary | ICD-10-CM | POA: Insufficient documentation

## 2013-06-17 DIAGNOSIS — I509 Heart failure, unspecified: Secondary | ICD-10-CM | POA: Insufficient documentation

## 2013-06-17 DIAGNOSIS — I5031 Acute diastolic (congestive) heart failure: Secondary | ICD-10-CM

## 2013-06-17 DIAGNOSIS — I10 Essential (primary) hypertension: Secondary | ICD-10-CM | POA: Insufficient documentation

## 2013-06-17 DIAGNOSIS — I428 Other cardiomyopathies: Secondary | ICD-10-CM | POA: Insufficient documentation

## 2013-06-17 DIAGNOSIS — R0609 Other forms of dyspnea: Secondary | ICD-10-CM | POA: Insufficient documentation

## 2013-06-17 NOTE — Progress Notes (Signed)
Echocardiogram performed.  

## 2013-06-24 ENCOUNTER — Encounter: Payer: Self-pay | Admitting: Cardiology

## 2013-06-24 ENCOUNTER — Ambulatory Visit (INDEPENDENT_AMBULATORY_CARE_PROVIDER_SITE_OTHER): Payer: Self-pay | Admitting: Cardiology

## 2013-06-24 VITALS — BP 111/65 | HR 97 | Ht 75.0 in | Wt 310.0 lb

## 2013-06-24 DIAGNOSIS — I4891 Unspecified atrial fibrillation: Secondary | ICD-10-CM

## 2013-06-24 DIAGNOSIS — I509 Heart failure, unspecified: Secondary | ICD-10-CM

## 2013-06-24 NOTE — Progress Notes (Signed)
Patient ID: Adam Lynn, male   DOB: May 08, 1956, 57 y.o.   MRN: [id number]  . Patient ID: Adam Lynn, male   DOB: [birth year], 57 y.o.   MRN: [id number]    Patient Name: Adam Lynn Date of Encounter: 06/24/2013  Primary Care Provider:  Jeanann Lewandowsky, MD Primary Cardiologist:  Tobias Alexander, H   Patient Profile  Follow up after hospital visit  Problem List   Past Medical History  Diagnosis Date  . Hypertension   . Atrial flutter with rapid ventricular response   . Acute on chronic systolic congestive heart failure     EF: 35% on 05/10/13  . Shortness of breath   . Sleep apnea   . Diabetes mellitus   . GERD (gastroesophageal reflux disease)   . Acute on chronic systolic congestive heart failure 05/10/2013  . Depression   . Anxiety    Past Surgical History  Procedure Laterality Date  . Cardiac catheterization    . Tonsillectomy    . Tee without cardioversion Left 05/07/2013    Procedure: TRANSESOPHAGEAL ECHOCARDIOGRAM (TEE);  Surgeon: Lars Masson, MD;  Location: [facility] ENDOSCOPY;  Service: Cardiovascular;  Laterality: Left;  . Cardioversion Left 05/07/2013    Procedure: CARDIOVERSION;  Surgeon: Lars Masson, MD;  Location: [facility] ENDOSCOPY;  Service: Cardiovascular;  Laterality: Left;    Allergies  Allergies  Allergen Reactions  . Penicillins Rash    HPI  57 year old male with a history of diabetes, hypertension, who was recently hospitalized with acute combined systolic and diastolic heart failure with LV ejection fraction 30-35%. The patient was found to be in atrial flutter with 2 to one block, he was cardioverted back to the sinus rhythm and was treated for significant fluid overload. The patient responded to diuretics very well and was started on ACE inhibitor, beta blocker, as well as the spironolactone.  Echocardiogram showed mildly dilated left ventricle with moderately decreased LV function EF 30-35%, left atrium is moderately dilated, right ventricle  is dilated with moderately decreased function, and mild pulmonary hypertension. He also had grades 2 diastolic dysfunction.  The patient is coming for 1 month follow up. He feels significantly better. No lower extremity edema, no PND or orthopnea, DOE on moderate exercise such as walking flight of stairs. He complains of SOB at the work environment where he is exposed to tobacco dust and chemicals.    Home Medications  Prior to Admission medications   Medication Sig Start Date End Date Taking? Authorizing Provider  aspirin EC 81 MG tablet Take 81 mg by mouth daily.   Yes Historical Provider, MD  furosemide (LASIX) 40 MG tablet Take 1 tablet (40 mg total) by mouth 2 (two) times daily. 05/12/13  Yes Rhonda G Barrett, PA-C  guaiFENesin (MUCINEX) 600 MG 12 hr tablet Take 600 mg by mouth 2 (two) times daily.   Yes Historical Provider, MD  hydrALAZINE (APRESOLINE) 25 MG tablet Take 1 tablet (25 mg total) by mouth 3 (three) times daily with meals. 05/12/13  Yes Rhonda G Barrett, PA-C  lisinopril (PRINIVIL,ZESTRIL) 40 MG tablet Take 1 tablet (40 mg total) by mouth daily. 05/12/13  Yes Rhonda G Barrett, PA-C  metFORMIN (GLUCOPHAGE) 1000 MG tablet Take 1,000 mg by mouth 2 (two) times daily with a meal.   Yes Historical Provider, MD  metoprolol succinate (TOPROL-XL) 25 MG 24 hr tablet Take 25 mg by mouth every morning. 05/12/13  Yes Rhonda G Barrett, PA-C  potassium chloride SA (K-DUR,KLOR-CON) 20 MEQ tablet Take 1  tablet (20 mEq total) by mouth 2 (two) times daily. 05/19/13  Yes Dorothea Ogle, MD  Rivaroxaban (XARELTO) 20 MG TABS tablet Take 1 tablet (20 mg total) by mouth daily. 05/19/13  Yes Dorothea Ogle, MD  sitaGLIPtin (JANUVIA) 50 MG tablet Take 50 mg by mouth daily.   Yes Historical Provider, MD  spironolactone (ALDACTONE) 25 MG tablet Take 1 tablet (25 mg total) by mouth daily. 05/12/13  Yes Rhonda G Barrett, PA-C    Family History  No family history on file.  Social History  History    Social History  . Marital Status: Single    Spouse Name: N/A    Number of Children: N/A  . Years of Education: N/A   Occupational History  . Not on file.   Social History Main Topics  . Smoking status: Former Smoker -- 0.50 packs/day    Types: Cigarettes  . Smokeless tobacco: Never Used  . Alcohol Use: No  . Drug Use: No  . Sexual Activity: Yes   Other Topics Concern  . Not on file   Social History Narrative  . No narrative on file     Review of Systems, as per history of present illness otherwise negative General:  No chills, fever, night sweats or weight changes.  Cardiovascular:  No chest pain, dyspnea on exertion, edema, orthopnea, palpitations, paroxysmal nocturnal dyspnea. Dermatological: No rash, lesions/masses Respiratory: No cough, dyspnea Urologic: No hematuria, dysuria Abdominal:   No nausea, vomiting, diarrhea, bright red blood per rectum, melena, or hematemesis Neurologic:  No visual changes, wkns, changes in mental status. All other systems reviewed and are otherwise negative except as noted above.  Physical Exam  Blood pressure 111/65, pulse 97, height 6\' 3"  (1.905 m), weight 310 lb (140.615 kg).  General: Pleasant, NAD Psych: Normal affect. Neuro: Alert and oriented X 3. Moves all extremities spontaneously. HEENT: Normal  Neck: Supple without bruits or JVD. Lungs:  Resp regular and unlabored, CTA. Heart: RRR no s3, s4, or murmurs. Abdomen: Soft, non-tender, non-distended, BS + x 4.  Extremities: No clubbing, cyanosis or edema. DP/PT/Radials 2+ and equal bilaterally.  Accessory Clinical Findings  ECG - sinus rhythm, 86 beats per minute, right bundle branch block, left anterior fascicular block, bifascicular block, abnormal EKG.  TTE 05/08/2013 - Left ventricle: The cavity size was mildly dilated. Wall thickness was increased in a pattern of moderate LVH. The estimated ejection fraction was 35%. Diffuse hypokinesis. Definity contrast was given.  Features are consistent with a pseudonormal left ventricular filling pattern, with concomitant abnormal relaxation and increased filling pressure (grade 2 diastolic dysfunction). - Aortic valve: Transvalvular velocity was within the normal range. There was no stenosis. - Aorta: Mildly dilated ascending aorta. Proximal ascending aorta measures 41 mm. Aortic root dimension: 37mm (ED). - Mitral valve: Trivial regurgitation. - Left atrium: The atrium was moderately dilated. - Right ventricle: The cavity size was mildly dilated. Systolic function was moderately reduced. - Right atrium: The atrium was moderately dilated. - Tricuspid valve: Peak RV-RA gradient: 23mm Hg (S). - Pulmonary arteries: PA systolic pressure 34-38 mmHg. - Systemic veins: IVC measured 2.4 cm with < 50% respirophasic variation, suggesting RA pressure 11-15 mmHg. Impressions:  - Mildly dilated LV with moderate LV hypertrophy. EF 35% with diffuse hypokinesis. Moderate diastolic dysfunction. Mildly dilated RV with moderately decreased systolic function. Mild pulmonary hypertension.  TTE 06/17/2013 Study Conclusions  - Left ventricle: The cavity size was normal. Wall thickness was normal. Systolic function was normal. The estimated ejection  fraction was in the range of 50% to 55%. Wall motion was normal; there were no regional wall motion abnormalities. Doppler parameters are consistent with abnormal left ventricular relaxation (grade 1 diastolic dysfunction). - Left atrium: The atrium was mildly dilated. - Right atrium: The atrium was mildly dilated. - Atrial septum: No defect or patent foramen ovale was identified. Impressions:  - Compared to the prior study from 05/07/2013 LV EF is mildly improved, currently 50-55%.    Assessment & Plan  1. Acute systolic congestive heart failure -  The patient appears euvolemic, we will continue the same regimen with Lasix 40 mg orally daily and spironolactone 25 mg  daily, we will check BMP today. Recent echocardiogram shows improvement of LV EF from 35% to 50-55%.  He still feels SOB on moderate exercise, we will refer him to cardiac rehab, until then he should restrain from moderate physical activity at work  2. 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 Atrial flutter currently in sinus rhythm following cardioversion - patient is still in sinus rhythm, we will continue beta blockers. We might consider discontinuation of Xarelto, if he remains in sinus rhythm at the next visit  4. Type 2 diabetes mellitus   5. Morbid obesity - the patient changed his diet and started to exercise slowly and he feels he already lost some weight  6. depression, anxiety - will give patient 30 pills of Xanax 0.5 mg every 8 hours when necessary, the patient is advised about possible development of dependency, there are no refills on those pills distressed to overcome these difficult time 40s patient. He feels better today and is determined to change is living environment where he is exposed to second hand smoking  Followup in 3 months   Tobias Alexander, Rexene Edison, MD 06/24/2013, 2:06 PM

## 2013-06-24 NOTE — Patient Instructions (Signed)
Your physician recommends that you schedule a follow-up appointment in: 3 MONTHS WITH DR. Johnell Comings have been referred to [facility]  Your physician recommends that you continue on your current medications as directed. Please refer to the Current Medication list given to you today.  Your physician recommends that you return for lab work in: BMET TODAY

## 2013-06-25 LAB — BASIC METABOLIC PANEL
BUN: 12 mg/dL (ref 6–23)
CO2: 26 mEq/L (ref 19–32)
Calcium: 9.5 mg/dL (ref 8.4–10.5)
Chloride: 101 mEq/L (ref 96–112)
Creat: 1.09 mg/dL (ref 0.50–1.35)
Glucose, Bld: 108 mg/dL — ABNORMAL HIGH (ref 70–99)
Potassium: 4.2 mEq/L (ref 3.5–5.3)
Sodium: 140 mEq/L (ref 135–145)

## 2013-06-29 ENCOUNTER — Ambulatory Visit: Payer: Self-pay | Admitting: Internal Medicine

## 2013-07-02 ENCOUNTER — Ambulatory Visit: Payer: Medicare Other | Attending: Internal Medicine | Admitting: Internal Medicine

## 2013-07-02 ENCOUNTER — Encounter: Payer: Self-pay | Admitting: Internal Medicine

## 2013-07-02 VITALS — BP 142/89 | HR 87 | Temp 98.8°F | Resp 14 | Ht 76.0 in | Wt 315.4 lb

## 2013-07-02 DIAGNOSIS — I4891 Unspecified atrial fibrillation: Secondary | ICD-10-CM

## 2013-07-02 DIAGNOSIS — I119 Hypertensive heart disease without heart failure: Secondary | ICD-10-CM

## 2013-07-02 DIAGNOSIS — E119 Type 2 diabetes mellitus without complications: Secondary | ICD-10-CM

## 2013-07-02 DIAGNOSIS — I5023 Acute on chronic systolic (congestive) heart failure: Secondary | ICD-10-CM

## 2013-07-02 DIAGNOSIS — F411 Generalized anxiety disorder: Secondary | ICD-10-CM | POA: Insufficient documentation

## 2013-07-02 DIAGNOSIS — Z7901 Long term (current) use of anticoagulants: Secondary | ICD-10-CM

## 2013-07-02 LAB — LIPID PANEL
Cholesterol: 171 mg/dL (ref 0–200)
HDL: 42 mg/dL (ref >39)
Triglycerides: 77 mg/dL (ref <150)
VLDL: 15 mg/dL (ref 0–40)

## 2013-07-02 LAB — CBC WITH DIFFERENTIAL/PLATELET
Basophils Absolute: 0 10*3/uL (ref 0.0–0.1)
Basophils Relative: 0 % (ref 0–1)
HCT: 42.3 % (ref 39.0–52.0)
Hemoglobin: 14.4 g/dL (ref 13.0–17.0)
Lymphocytes Relative: 19 % (ref 12–46)
Lymphs Abs: 1.3 10*3/uL (ref 0.7–4.0)
MCHC: 34 g/dL (ref 30.0–36.0)
Monocytes Absolute: 0.6 10*3/uL (ref 0.1–1.0)
Monocytes Relative: 8 % (ref 3–12)
Neutro Abs: 4.8 10*3/uL (ref 1.7–7.7)
Neutrophils Relative %: 69 % (ref 43–77)
RBC: 5.28 MIL/uL (ref 4.22–5.81)
RDW: 15.3 % (ref 11.5–15.5)
WBC: 7 10*3/uL (ref 4.0–10.5)

## 2013-07-02 MED ORDER — LISINOPRIL 40 MG PO TABS: 40.0000 mg | ORAL_TABLET | Freq: Every day | ORAL | Status: DC

## 2013-07-02 MED ORDER — HYDROCHLOROTHIAZIDE 25 MG PO TABS: 25.0000 mg | ORAL_TABLET | Freq: Every day | ORAL | Status: DC

## 2013-07-02 MED ORDER — DAPAGLIFLOZIN PROPANEDIOL 5 MG PO TABS: 5.0000 mg | ORAL_TABLET | Freq: Every day | ORAL | Status: DC

## 2013-07-02 NOTE — Progress Notes (Unsigned)
Patient ID: Adam Lynn, male   DOB: 22-Aug-1955, 57 y.o.   MRN: [id number] Patient Demographics  Adam Lynn, is a 57 y.o. male  WUJ:[id number]  [id number]  DOB - 07-08-1956  Chief Complaint  Patient presents with  . Follow-up        Subjective:   Adam Lynn today is here to establish primary care. Patient is a 57 year old male with history of chronic systolic failure, hypertension, atrial fibrillation on xarelto, follows Dr. Andreas Ohm from [facility] heart care. The patient presented here for followup. Patient reports no complaints at this time. Needs refills for lisinopril, hydrochlorothiazide.   Patient has No headache, No chest pain, No abdominal pain - No Nausea, No new weakness tingling or numbness, No Cough - SOB.  Objective:    Filed Vitals:   07/02/13 0921  BP: 142/89  Pulse: 87  Temp: 98.8 F (37.1 C)  TempSrc: Oral  Resp: 14  Height: 6\' 4"  (1.93 m)  Weight: 315 lb 6.4 oz (143.065 kg)  SpO2: 94%     ALLERGIES:   Allergies  Allergen Reactions  . Penicillins Rash    PAST MEDICAL HISTORY: Past Medical History  Diagnosis Date  . Hypertension   . Atrial flutter with rapid ventricular response   . Acute on chronic systolic congestive heart failure     EF: 35% on 05/10/13  . Shortness of breath   . Sleep apnea   . Diabetes mellitus   . GERD (gastroesophageal reflux disease)   . Acute on chronic systolic congestive heart failure 05/10/2013  . Depression   . Anxiety     PAST SURGICAL HISTORY: Past Surgical History  Procedure Laterality Date  . Cardiac catheterization    . Tonsillectomy    . Tee without cardioversion Left 05/07/2013    Procedure: TRANSESOPHAGEAL ECHOCARDIOGRAM (TEE);  Surgeon: Lars Masson, MD;  Location: [facility] ENDOSCOPY;  Service: Cardiovascular;  Laterality: Left;  . Cardioversion Left 05/07/2013    Procedure: CARDIOVERSION;  Surgeon: Lars Masson, MD;  Location: [facility] ENDOSCOPY;  Service: Cardiovascular;  Laterality: Left;     FAMILY HISTORY: No family history on file.  MEDICATIONS AT HOME: Prior to Admission medications   Medication Sig Start Date End Date Taking? Authorizing Provider  ALPRAZolam Prudy Feeler) 0.5 MG tablet Take 1 tablet (0.5 mg total) by mouth at bedtime as needed for anxiety. 05/27/13  Yes Lars Masson, MD  aspirin EC 81 MG tablet    Yes Historical Provider, MD  furosemide (LASIX) 40 MG tablet 40 mg 2 (two) times daily.  05/12/13  Yes Rhonda G Barrett, PA-C  hydrALAZINE (APRESOLINE) 25 MG tablet Take 25 mg by mouth 3 (three) times daily. Pt out of meds 05/12/13  Yes Rhonda G Barrett, PA-C  hydrochlorothiazide (HYDRODIURIL) 25 MG tablet Take 1 tablet (25 mg total) by mouth daily. 07/02/13  Yes Ripudeep Jenna Luo, MD  lisinopril (PRINIVIL,ZESTRIL) 40 MG tablet Take 1 tablet (40 mg total) by mouth daily. 07/02/13  Yes Ripudeep Jenna Luo, MD  metFORMIN (GLUCOPHAGE) 500 MG tablet Take 500 mg by mouth daily with breakfast.   Yes Historical Provider, MD  potassium chloride SA (K-DUR,KLOR-CON) 20 MEQ tablet  05/19/13  Yes Dorothea Ogle, MD  Rivaroxaban (XARELTO) 20 MG TABS tablet Take 1 tablet (20 mg total) by mouth daily. 05/19/13  Yes Dorothea Ogle, MD  spironolactone (ALDACTONE) 25 MG tablet Take 1 tablet (25 mg total) by mouth daily. 05/12/13  Yes Rhonda G Barrett, PA-C  Dapagliflozin Propanediol 5 MG TABS  Take 5 mg by mouth daily. 07/02/13   Ripudeep Jenna Luo, MD    REVIEW OF SYSTEMS:  Constitutional:   No   Fevers, chills, fatigue.  HEENT:    No headaches, Sore throat,   Cardio-vascular: No chest pain,  Orthopnea, swelling in lower extremities, anasarca, palpitations  GI:  No abdominal pain, nausea, vomiting, diarrhea  Resp: No shortness of breath,  No coughing up of blood.No cough.No wheezing.  Skin:  no rash or lesions.  GU:  no dysuria, change in color of urine, no urgency or frequency.  No flank pain.  Musculoskeletal: No joint pain or swelling.  No decreased range of motion.  No  back pain.  Psych: No change in mood or affect. No depression or anxiety.  No memory loss.   Exam  General appearance :Awake, alert, NAD, Speech Clear. HEENT: Atraumatic and Normocephalic, PERLA Neck: supple, no JVD. No cervical lymphadenopathy.  Chest: clear to auscultation bilaterally, no wheezing, rales or rhonchi CVS: S1 S2 regular, no murmurs.  Abdomen: soft, NBS, NT, ND, no gaurding, rigidity or rebound. Extremities: No cyanosis, clubbing, B/L Lower Ext shows no edema,  Neurology: Awake alert, and oriented X 3, CN II-XII intact, Non focal Skin:No Rash or lesions Wounds: N/A    Data Review   Basic Metabolic Panel: No results found for this basename: NA, K, CL, CO2, GLUCOSE, BUN, CREATININE, CALCIUM, MG, PHOS,  in the last 168 hours Liver Function Tests: No results found for this basename: AST, ALT, ALKPHOS, BILITOT, PROT, ALBUMIN,  in the last 168 hours  CBC: No results found for this basename: WBC, NEUTROABS, HGB, HCT, MCV, PLT,  in the last 168 hours ------------------------------------------------------------------------------------------------------------------ No results found for this basename: HGBA1C,  in the last 72 hours ------------------------------------------------------------------------------------------------------------------ No results found for this basename: CHOL, HDL, LDLCALC, TRIG, CHOLHDL, LDLDIRECT,  in the last 72 hours ------------------------------------------------------------------------------------------------------------------ No results found for this basename: TSH, T4TOTAL, FREET3, T3FREE, THYROIDAB,  in the last 72 hours ------------------------------------------------------------------------------------------------------------------ No results found for this basename: VITAMINB12, FOLATE, FERRITIN, TIBC, IRON, RETICCTPCT,  in the last 72 hours  Coagulation profile  No results found for this basename: INR, PROTIME,  in the last 168  hours    Assessment & Plan   Active Problems: Patient Active Problem List   Diagnosis Date Noted  . Anxiety state, unspecified - Patient received Xanax as needed from Dr. Delton See   07/02/2013  . Acute on chronic systolic congestive heart failure - Currently stable, continue Lasix, spironolactone as per cardiology recommendations, recent labs checked showed stable creatinine and potassium   05/10/2013  . Diabetes mellitus type 2, noninsulin dependent - Continue metformin 500 mg daily, patient does not want to take Januvia - Will change to [place] 5 mg daily from our pharmacy   05/09/2013  . Atrial fibrillation - Continue management per cardiology, on xarelto  05/09/2013  . Long-term (current) use of anticoagulants - On xarelto   05/09/2013  . Hypertensive heart disease - Stable, refilled lisinopril, HCTZ   05/07/2013  . Morbid obesity -  counseled on diet and weight control  05/07/2013    Nicotine abuse - Counseled patient on smoking cessation   Recommendations:Check lipid panel, CBC, hemoglobin A1c  Follow-up in 3 months   RAI,RIPUDEEP M.D. 07/02/2013, 9:40 AM

## 2013-07-06 ENCOUNTER — Other Ambulatory Visit: Payer: Self-pay | Admitting: Emergency Medicine

## 2013-07-06 MED ORDER — SITAGLIPTIN PHOSPHATE 50 MG PO TABS: 50.0000 mg | ORAL_TABLET | Freq: Every day | ORAL | Status: DC

## 2013-07-09 ENCOUNTER — Encounter (HOSPITAL_COMMUNITY)
Admission: RE | Admit: 2013-07-09 | Discharge: 2013-07-09 | Disposition: A | Discharge status: Home / Self Care | Payer: Medicare Other | Source: Ambulatory Visit | Attending: Cardiology | Admitting: Cardiology

## 2013-07-09 DIAGNOSIS — I428 Other cardiomyopathies: Secondary | ICD-10-CM | POA: Insufficient documentation

## 2013-07-09 DIAGNOSIS — Z7901 Long term (current) use of anticoagulants: Secondary | ICD-10-CM | POA: Insufficient documentation

## 2013-07-09 DIAGNOSIS — E119 Type 2 diabetes mellitus without complications: Secondary | ICD-10-CM | POA: Insufficient documentation

## 2013-07-09 DIAGNOSIS — G4730 Sleep apnea, unspecified: Secondary | ICD-10-CM | POA: Insufficient documentation

## 2013-07-09 DIAGNOSIS — I2789 Other specified pulmonary heart diseases: Secondary | ICD-10-CM | POA: Insufficient documentation

## 2013-07-09 DIAGNOSIS — Z5189 Encounter for other specified aftercare: Secondary | ICD-10-CM | POA: Insufficient documentation

## 2013-07-09 DIAGNOSIS — I4892 Unspecified atrial flutter: Secondary | ICD-10-CM | POA: Insufficient documentation

## 2013-07-09 DIAGNOSIS — I5022 Chronic systolic (congestive) heart failure: Secondary | ICD-10-CM | POA: Insufficient documentation

## 2013-07-09 DIAGNOSIS — F17200 Nicotine dependence, unspecified, uncomplicated: Secondary | ICD-10-CM | POA: Insufficient documentation

## 2013-07-09 DIAGNOSIS — I4891 Unspecified atrial fibrillation: Secondary | ICD-10-CM | POA: Insufficient documentation

## 2013-07-09 DIAGNOSIS — I509 Heart failure, unspecified: Secondary | ICD-10-CM | POA: Insufficient documentation

## 2013-07-09 DIAGNOSIS — I110 Hypertensive heart disease with heart failure: Secondary | ICD-10-CM | POA: Insufficient documentation

## 2013-07-09 NOTE — Progress Notes (Signed)
Adam Lynn is  Here for cardiac rehab orientation. Adam Lynn does not have a blood glucose meter. Dr Louis Meckel office called and notified spoke with [name]. Patient notified that he can pick up his blood glucose meter today after orientation.

## 2013-07-09 NOTE — Progress Notes (Signed)
Cardiac Rehab Medication Review by a Pharmacist  Does the patient  feel that his/her medications are working for him/her?  yes  Has the patient been experiencing any side effects to the medications prescribed?  no  Does the patient measure his/her own blood pressure or blood glucose at home?  no   Does the patient have any problems obtaining medications due to transportation or finances?   no  Understanding of regimen: good Understanding of indications: good Potential of compliance: excellent    Pharmacist comments: He brought his medications with him today. Patient requesting meter and pill cutter.  Patient uses pill box and seems very compliant.  He understands the importance of his medications and has bought in to his health.      Baird Kay 07/09/2013 8:27 AM

## 2013-07-13 ENCOUNTER — Encounter (HOSPITAL_COMMUNITY): Payer: Self-pay

## 2013-07-13 ENCOUNTER — Encounter (HOSPITAL_COMMUNITY)
Admission: RE | Admit: 2013-07-13 | Discharge: 2013-07-13 | Disposition: A | Discharge status: Home / Self Care | Payer: Medicare Other | Source: Ambulatory Visit | Attending: Cardiology | Admitting: Cardiology

## 2013-07-13 LAB — GLUCOSE, CAPILLARY
Glucose-Capillary: 200 mg/dL — ABNORMAL HIGH (ref 70–99)
Glucose-Capillary: 102 mg/dL — ABNORMAL HIGH (ref 70–99)

## 2013-07-13 NOTE — Progress Notes (Addendum)
Pt started cardiac rehab today.  Pt tolerated light exercise without difficulty.  VSS, Telemetry-sinus rhythm, occasional PVC.  Pt c/o dyspnea post exercise 02-sat 96%.  Otherwise asymptomatic.    Pt is current 1/2 ppd smoker. Pt does have desire to quit, he knows it is  a choice;   he is a stress smoker has had successful cessation using the patch in the recent past.  Pt referred to [employer]quitnow. .   Pt does not have glucometer for home use and has not started farxiga 5mg  as prescribed.  Pt states he will pick up medicine from pharmacy today.  Pt oriented to exercise equipment and routine.  Understanding verbalized. Psychosocial assessment:    PHQ-7.  This is partly due to situational stress.  Pt is currently living in a boarding house where he was placed [facility].  Pt has a history of homelessness and transient housing over the past several years.  Pt is currently employed for [employer] which he would like to change to improve chances of smoking cessation and other health concerns. Pt given vocational rehab packet.  Pt does have a strong faith base which he believes gets him through difficult times and directs him to make productive life changes.  Pt is also in long distance relationship with a woman from [place].  He would like to visit her soon and perhaps eventually move there.  However pt states he is committed to rehab participation and would like to complete the program. Pt is accepting of offer for behavioral health referral.  appt will be scheduled

## 2013-07-15 ENCOUNTER — Encounter (HOSPITAL_COMMUNITY)
Admission: RE | Admit: 2013-07-15 | Discharge: 2013-07-15 | Disposition: A | Discharge status: Home / Self Care | Payer: Medicare Other | Source: Ambulatory Visit | Attending: Cardiology | Admitting: Cardiology

## 2013-07-15 LAB — GLUCOSE, CAPILLARY
Glucose-Capillary: 114 mg/dL — ABNORMAL HIGH (ref 70–99)
Glucose-Capillary: 136 mg/dL — ABNORMAL HIGH (ref 70–99)

## 2013-07-15 NOTE — Progress Notes (Signed)
Pt verbalizes some food insecurity at home. Pt states he uses a microwave only to cook in his room. Pt is interested in eating healthier meals.  Pt given fresh fruit to take home. Pt states he has received his glucometer from PCP.   Pt given information about [employer] program in January. Pt states he is interested in participating and will make great efforts to attend.

## 2013-07-20 ENCOUNTER — Telehealth: Payer: Self-pay | Admitting: Cardiology

## 2013-07-20 ENCOUNTER — Encounter (HOSPITAL_COMMUNITY)
Admission: RE | Admit: 2013-07-20 | Discharge: 2013-07-20 | Disposition: A | Discharge status: Home / Self Care | Payer: Medicare Other | Source: Ambulatory Visit | Attending: Cardiology | Admitting: Cardiology

## 2013-07-20 LAB — GLUCOSE, CAPILLARY: Glucose-Capillary: 156 mg/dL — ABNORMAL HIGH (ref 70–99)

## 2013-07-20 NOTE — Progress Notes (Signed)
Pt weight up 7 lb today at cardiac rehab since 07/15/13.  This is actually up a total of 15lb since last OV on 06/24/13.    Pt c/o dyspnea on exertion, trace lower extremity edema, and abdominal distention.  Denies pain, dyspnea at rest or orthopnea.  Pt uses 2 pillows at night, same as usual.  sat- 98% ra,  Pt reports he has had increased polish sausage, hot dogs and salty snacks.  Pt is limited on access to fruits and vegetables.  Spoke to [employer], Engineer, materials, who advised pt to take extra lasix 40mg  today and tomorrow.  Pt given these instructions.    Pt instructed to weigh daily and call office for weight gain greater than 3-5lb in 1 day or 1 week. If unable to weigh, pt instructed to call office if pants or shoes feel tight.   Pt also instructed to follow low sodium diet, handouts given.  Written and verbal instructions given.  Pt verbalized understanding.

## 2013-07-20 NOTE — Telephone Encounter (Signed)
New  problem    C/O 7   lbs weight gain  Since Wednesday. 7 lbs since last office visit .   Sob on exertion.

## 2013-07-20 NOTE — Telephone Encounter (Signed)
JoAnn at cardiac rehab calls b/c pt has had a 7 pound weight gain since last Wednesday at Cardiac Rehab.  12/11  Weight   315 12/24  Weight   318 12/29  Weight   325  States pt has not taken any extra lasix. States his abdomen appears distended & trace leg edema. Does not appear short of breath. Asked that pt weigh daily & call office with 2-3 pound weight gain over night   Chyrl Civatte states pt lives in a boarding house, he does not have access to scales unless it is at Cardiac Rehab. States he "is out of minutes on his phone" and at present there is no way to contact him except for when he comes to Cardiac Rehab  Asked pt take an extra lasix today & tomorrow. Pt is already on supplemental Potassium of bid. Pt is still at rehab & she will convey to pt. Chyrl Civatte stated they will reassess him again on Wednesday when he comes in for Cardiac Rehab.  Will forward this to Dr. Delton See. Mylo Red RN

## 2013-07-21 NOTE — Telephone Encounter (Signed)
Adam Lynn, Thank you so much for taking this message. Do you know Adam Lynn's last name? How can I contact her? Thank you, Aris Lot

## 2013-07-22 ENCOUNTER — Encounter (HOSPITAL_COMMUNITY)
Admission: RE | Admit: 2013-07-22 | Discharge: 2013-07-22 | Disposition: A | Discharge status: Home / Self Care | Payer: Medicare Other | Source: Ambulatory Visit | Attending: Cardiology | Admitting: Cardiology

## 2013-07-22 LAB — GLUCOSE, CAPILLARY
Glucose-Capillary: 107 mg/dL — ABNORMAL HIGH (ref 70–99)
Glucose-Capillary: 133 mg/dL — ABNORMAL HIGH (ref 70–99)

## 2013-07-22 MED ORDER — FUROSEMIDE 40 MG PO TABS: 40.0000 mg | ORAL_TABLET | Freq: Two times a day (BID) | ORAL | Status: DC

## 2013-07-22 NOTE — Telephone Encounter (Signed)
I spoke with Adam Lynn in cardiac rehab this am. Pt is exercising there at this time.  Weight today is 320    Pt has been taking furosemide 40mg  twice a day since Monday  Adam Lynn states that pt has been eating [place] sausage three times a day.  She has counseled him on that. Also notes pt does have access to scales & can weigh self at home.  Dr.  Delton See has reviewed & ordered pt to continue lasix 40mg  twice a day & be seen by her in the office next week.  I have relayed these orders to Adam Lynn & the patient. I have also made pt an appointment to see Dr. Delton See on 07/29/13  Adam Red RN

## 2013-07-22 NOTE — Addendum Note (Signed)
Addended by: Lisabeth Devoid F on: 07/22/2013 09:52 AM   Modules accepted: Orders

## 2013-07-24 ENCOUNTER — Encounter (HOSPITAL_COMMUNITY)
Admission: RE | Admit: 2013-07-24 | Discharge: 2013-07-24 | Disposition: A | Discharge status: Home / Self Care | Payer: Medicare Other | Source: Ambulatory Visit | Attending: Cardiology | Admitting: Cardiology

## 2013-07-24 DIAGNOSIS — I428 Other cardiomyopathies: Secondary | ICD-10-CM | POA: Insufficient documentation

## 2013-07-24 DIAGNOSIS — G4730 Sleep apnea, unspecified: Secondary | ICD-10-CM | POA: Insufficient documentation

## 2013-07-24 DIAGNOSIS — Z5189 Encounter for other specified aftercare: Secondary | ICD-10-CM | POA: Insufficient documentation

## 2013-07-24 DIAGNOSIS — I5022 Chronic systolic (congestive) heart failure: Secondary | ICD-10-CM | POA: Insufficient documentation

## 2013-07-24 DIAGNOSIS — I4891 Unspecified atrial fibrillation: Secondary | ICD-10-CM | POA: Insufficient documentation

## 2013-07-24 DIAGNOSIS — Z7901 Long term (current) use of anticoagulants: Secondary | ICD-10-CM | POA: Insufficient documentation

## 2013-07-24 DIAGNOSIS — I2789 Other specified pulmonary heart diseases: Secondary | ICD-10-CM | POA: Insufficient documentation

## 2013-07-24 DIAGNOSIS — I4892 Unspecified atrial flutter: Secondary | ICD-10-CM | POA: Insufficient documentation

## 2013-07-24 DIAGNOSIS — E119 Type 2 diabetes mellitus without complications: Secondary | ICD-10-CM | POA: Insufficient documentation

## 2013-07-24 DIAGNOSIS — I110 Hypertensive heart disease with heart failure: Secondary | ICD-10-CM | POA: Insufficient documentation

## 2013-07-24 DIAGNOSIS — F17200 Nicotine dependence, unspecified, uncomplicated: Secondary | ICD-10-CM | POA: Insufficient documentation

## 2013-07-24 DIAGNOSIS — I509 Heart failure, unspecified: Secondary | ICD-10-CM | POA: Insufficient documentation

## 2013-07-24 LAB — GLUCOSE, CAPILLARY
Glucose-Capillary: 165 mg/dL — ABNORMAL HIGH (ref 70–99)
Glucose-Capillary: 90 mg/dL (ref 70–99)

## 2013-07-27 ENCOUNTER — Encounter (HOSPITAL_COMMUNITY)
Admission: RE | Admit: 2013-07-27 | Discharge: 2013-07-27 | Disposition: A | Discharge status: Home / Self Care | Payer: Medicare Other | Source: Ambulatory Visit | Attending: Cardiology | Admitting: Cardiology

## 2013-07-27 LAB — GLUCOSE, CAPILLARY
Glucose-Capillary: 151 mg/dL — ABNORMAL HIGH (ref 70–99)
Glucose-Capillary: 101 mg/dL — ABNORMAL HIGH (ref 70–99)

## 2013-07-27 NOTE — Progress Notes (Signed)
Pt weight up 2kg today at cardiac rehab. Pt reports he ate [place] [employer] chicken yesterday. Pt c/o increased pedal edema, denies dyspnea, orthopnea.  Pt also reports he ran out of lasix yesterday, therefore missed lasix dose last night and this am.  Pt has not picked up prescription called into pharmacy last week.  Pt instructed to pick up lasix this am and take lasix 40mg  BID. Pt has appt with Dr. Delton See 07/29/13.   Understanding verbalized.

## 2013-07-27 NOTE — Progress Notes (Signed)
Nutrition Note Spoke with pt. Pt wants to lose wt. Pt educated re: wt loss tips and 2200-2400 kcal, 5-day menu ideas. Pt expressed understanding. Continue client-centered nutrition education by RD as part of interdisciplinary care.  Monitor and evaluate progress toward nutrition goal with team.  Mickle Plumb, M.Ed, RD, LDN, CDE 07/27/2013 9:12 AM

## 2013-07-29 ENCOUNTER — Encounter (HOSPITAL_COMMUNITY)
Admission: RE | Admit: 2013-07-29 | Discharge: 2013-07-29 | Disposition: A | Discharge status: Home / Self Care | Payer: Medicare Other | Source: Ambulatory Visit | Attending: Cardiology | Admitting: Cardiology

## 2013-07-29 ENCOUNTER — Ambulatory Visit (INDEPENDENT_AMBULATORY_CARE_PROVIDER_SITE_OTHER): Payer: Self-pay | Admitting: Cardiology

## 2013-07-29 VITALS — BP 112/77 | HR 91 | Ht 76.5 in | Wt 311.0 lb

## 2013-07-29 DIAGNOSIS — I119 Hypertensive heart disease without heart failure: Secondary | ICD-10-CM

## 2013-07-29 LAB — BASIC METABOLIC PANEL
BUN: 15 mg/dL (ref 6–23)
CO2: 27 mEq/L (ref 19–32)
Calcium: 9.5 mg/dL (ref 8.4–10.5)
Chloride: 102 mEq/L (ref 96–112)
Creatinine, Ser: 1.3 mg/dL (ref 0.4–1.5)
GFR: 73.36 mL/min (ref >60.00)
Glucose, Bld: 116 mg/dL — ABNORMAL HIGH (ref 70–99)
Potassium: 3.9 mEq/L (ref 3.5–5.1)
Sodium: 137 mEq/L (ref 135–145)

## 2013-07-29 LAB — GLUCOSE, CAPILLARY: Glucose-Capillary: 121 mg/dL — ABNORMAL HIGH (ref 70–99)

## 2013-07-29 NOTE — Patient Instructions (Signed)
Your physician has recommended you make the following change in your medication: stop taking Xarelto  Your physician recommends that you return for lab work in: today  Your physician recommends that you schedule a follow-up appointment in: 2 months

## 2013-07-29 NOTE — Progress Notes (Addendum)
Adam Lynn 58 y.o. male Nutrition Note Spoke with pt.  Nutrition Plan and Nutrition Survey goals reviewed with pt. Pt is following Step 2 of the Therapeutic Lifestyle Changes diet. 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 Pt wants to lose wt. Pt has been trying to lose wt by decreasing portion sizes. Wt loss tips previously reviewed. Pt denies questions re: materials given 07/27/13. Pt is diabetic. Last A1c indicates blood glucose poorly controlled. Pt states CBG's poorly controlled "because I ate whatever I wanted, whenever I wanted." Pt reports he previously consumed soda and junk food "freely." Pt now watching his diet within his financial and living conditions. Pt checks his CBG's "every other day because blood sugar is checked twice on rehab days. Pt reports CBG's whether fasting or non-fasting are usually 130-140 mg/dL. This Clinical research associate went over Diabetes Education test results. Per nutrition screen, pt reports difficulty buying food. Pt states he continues to have difficulty buying food. Community resources for food discussed. Pt expressed understanding of the information reviewed. Pt aware of nutrition education classes offered.  Nutrition Diagnosis Food-and nutrition-related knowledge deficit related to lack of exposure to information as related to diagnosis of: ? CVD ? DM (A1c 8.6)  Obesity related to excessive energy intake as evidenced by a BMI of 38.8  Nutrition RX/ Estimated Daily Nutrition Needs for: wt loss  2200-2900 Kcal, 60-80 gm fat, 14-19 gm sat fat, 2.1-2.9 gm trans-fat, <1500 mg sodium, 325-400 gm CHO   Nutrition Intervention   Pt's individual nutrition plan reviewed with pt.   Benefits of adopting Therapeutic Lifestyle Changes discussed when Medficts reviewed.   Pt to attend the Portion Distortion  class   Pt given handouts for: ? Nutrition I class ? Nutrition II class ? Guide to making healthier choices when dining out ? Diabetes Blitz class ? Difficulty buying food handout   Continue client-centered nutrition education by RD, as part of interdisciplinary care. Goal(s)   Pt to identify and limit food sources of saturated fat, trans fat, and cholesterol   Pt to identify food quantities necessary to achieve: ? wt loss to a goal wt of 298-316 lb (135.5-143.7 kg) at graduation from cardiac rehab.    CBG concentrations in the normal range or as close to normal as is safely possible. Monitor and Evaluate progress toward nutrition goal with team. Nutrition Risk: Change to Moderate Mickle Plumb, M.Ed, RD, LDN, CDE 07/29/2013 9:43 AM

## 2013-07-29 NOTE — Progress Notes (Signed)
Patient ID: Adam Lynn, male   DOB: August 25, 1955, 58 y.o.   MRN: [id number]      Patient Name: Adam Lynn Date of Encounter: 07/29/2013  Primary Care Provider:  Jeanann Lewandowsky, MD Primary Cardiologist:  Tobias Alexander, H   Patient Profile  Follow up after hospital visit  Problem List   Past Medical History  Diagnosis Date  . Hypertension   . Atrial flutter with rapid ventricular response   . Acute on chronic systolic congestive heart failure     EF: 35% on 05/10/13  . Shortness of breath   . Sleep apnea   . Diabetes mellitus   . GERD (gastroesophageal reflux disease)   . Acute on chronic systolic congestive heart failure 05/10/2013  . Depression   . Anxiety    Past Surgical History  Procedure Laterality Date  . Cardiac catheterization    . Tonsillectomy    . Tee without cardioversion Left 05/07/2013    Procedure: TRANSESOPHAGEAL ECHOCARDIOGRAM (TEE);  Surgeon: Lars Masson, MD;  Location: [facility] ENDOSCOPY;  Service: Cardiovascular;  Laterality: Left;  . Cardioversion Left 05/07/2013    Procedure: CARDIOVERSION;  Surgeon: Lars Masson, MD;  Location: [facility] ENDOSCOPY;  Service: Cardiovascular;  Laterality: Left;    Allergies  Allergies  Allergen Reactions  . Penicillins Rash    HPI  58 year old male with a history of diabetes, hypertension, who was recently hospitalized with acute combined systolic and diastolic heart failure with LV ejection fraction 30-35%. The patient was found to be in atrial flutter with 2 to one block, he was cardioverted back to the sinus rhythm and was treated for significant fluid overload. The patient responded to diuretics very well and was started on ACE inhibitor, beta blocker, as well as the spironolactone.  Echocardiogram showed mildly dilated left ventricle with moderately decreased LV function EF 30-35%, left atrium is moderately dilated, right ventricle is dilated with moderately decreased function, and mild pulmonary  hypertension. He also had grades 2 diastolic dysfunction.  After 1 month the patient felt significantly better. No lower extremity edema, no PND or orthopnea, DOE on moderate exercise such as walking flight of stairs. He complains of SOB at the work environment where he is exposed to tobacco dust and chemicals.    Today he is feeling almost back to baseline, he is involved in cardiac rehab, full of energy. He will be relocating to [place] in two months. He will still follow with [place]. No CP, SOB, no LE edema. He has a new girlfriend and is inquiring about sildenafil. He started a nutrition progream as has lost 12 lbs.   Home Medications  Prior to Admission medications   Medication Sig Start Date End Date Taking? Authorizing Provider  aspirin EC 81 MG tablet Take 81 mg by mouth daily.   Yes Historical Provider, MD  furosemide (LASIX) 40 MG tablet Take 1 tablet (40 mg total) by mouth 2 (two) times daily. 05/12/13  Yes Rhonda G Barrett, PA-C  guaiFENesin (MUCINEX) 600 MG 12 hr tablet Take 600 mg by mouth 2 (two) times daily.   Yes Historical Provider, MD  hydrALAZINE (APRESOLINE) 25 MG tablet Take 1 tablet (25 mg total) by mouth 3 (three) times daily with meals. 05/12/13  Yes Rhonda G Barrett, PA-C  lisinopril (PRINIVIL,ZESTRIL) 40 MG tablet Take 1 tablet (40 mg total) by mouth daily. 05/12/13  Yes Rhonda G Barrett, PA-C  metFORMIN (GLUCOPHAGE) 1000 MG tablet Take 1,000 mg by mouth 2 (two) times daily with a  meal.   Yes Historical Provider, MD  metoprolol succinate (TOPROL-XL) 25 MG 24 hr tablet Take 25 mg by mouth every morning. 05/12/13  Yes Rhonda G Barrett, PA-C  potassium chloride SA (K-DUR,KLOR-CON) 20 MEQ tablet Take 1 tablet (20 mEq total) by mouth 2 (two) times daily. 05/19/13  Yes Dorothea Ogle, MD  Rivaroxaban (XARELTO) 20 MG TABS tablet Take 1 tablet (20 mg total) by mouth daily. 05/19/13  Yes Dorothea Ogle, MD  sitaGLIPtin (JANUVIA) 50 MG tablet Take 50 mg by mouth daily.   Yes  Historical Provider, MD  spironolactone (ALDACTONE) 25 MG tablet Take 1 tablet (25 mg total) by mouth daily. 05/12/13  Yes Rhonda G Barrett, PA-C    Family History  No family history on file.  Social History  History   Social History  . Marital Status: Single    Spouse Name: N/A    Number of Children: N/A  . Years of Education: N/A   Occupational History  . Not on file.   Social History Main Topics  . Smoking status: Current Every Day Smoker -- 1.00 packs/day for 33 years    Types: Cigarettes  . Smokeless tobacco: Never Used  . Alcohol Use: No  . Drug Use: No  . Sexual Activity: Yes   Other Topics Concern  . Not on file   Social History Narrative  . No narrative on file     Review of Systems, as per history of present illness otherwise negative General:  No chills, fever, night sweats or weight changes.  Cardiovascular:  No chest pain, dyspnea on exertion, edema, orthopnea, palpitations, paroxysmal nocturnal dyspnea. Dermatological: No rash, lesions/masses Respiratory: No cough, dyspnea Urologic: No hematuria, dysuria Abdominal:   No nausea, vomiting, diarrhea, bright red blood per rectum, melena, or hematemesis Neurologic:  No visual changes, wkns, changes in mental status. All other systems reviewed and are otherwise negative except as noted above.  Physical Exam  Blood pressure 112/77, pulse 91, height 6' 4.5" (1.943 m), weight 311 lb (141.069 kg).  General: Pleasant, NAD Psych: Normal affect. Neuro: Alert and oriented X 3. Moves all extremities spontaneously. HEENT: Normal  Neck: Supple without bruits or JVD. Lungs:  Resp regular and unlabored, CTA. Heart: RRR no s3, s4, or murmurs. Abdomen: Soft, non-tender, non-distended, BS + x 4.  Extremities: No clubbing, cyanosis or edema. DP/PT/Radials 2+ and equal bilaterally.  Accessory Clinical Findings  ECG - sinus rhythm, 86 beats per minute, right bundle branch block, left anterior fascicular block,  bifascicular block, abnormal EKG.  TTE 05/08/2013 - Left ventricle: The cavity size was mildly dilated. Wall thickness was increased in a pattern of moderate LVH. The estimated ejection fraction was 35%. Diffuse hypokinesis. Definity contrast was given. Features are consistent with a pseudonormal left ventricular filling pattern, with concomitant abnormal relaxation and increased filling pressure (grade 2 diastolic dysfunction). - Aortic valve: Transvalvular velocity was within the normal range. There was no stenosis. - Aorta: Mildly dilated ascending aorta. Proximal ascending aorta measures 41 mm. Aortic root dimension: 37mm (ED). - Mitral valve: Trivial regurgitation. - Left atrium: The atrium was moderately dilated. - Right ventricle: The cavity size was mildly dilated. Systolic function was moderately reduced. - Right atrium: The atrium was moderately dilated. - Tricuspid valve: Peak RV-RA gradient: 23mm Hg (S). - Pulmonary arteries: PA systolic pressure 34-38 mmHg. - Systemic veins: IVC measured 2.4 cm with < 50% respirophasic variation, suggesting RA pressure 11-15 mmHg. Impressions:  - Mildly dilated LV with moderate LV  hypertrophy. EF 35% with diffuse hypokinesis. Moderate diastolic dysfunction. Mildly dilated RV with moderately decreased systolic function. Mild pulmonary hypertension.  TTE 06/17/2013 Study Conclusions  - Left ventricle: The cavity size was normal. Wall thickness was normal. Systolic function was normal. The estimated ejection fraction was in the range of 50% to 55%. Wall motion was normal; there were no regional wall motion abnormalities. Doppler parameters are consistent with abnormal left ventricular relaxation (grade 1 diastolic dysfunction). - Left atrium: The atrium was mildly dilated. - Right atrium: The atrium was mildly dilated. - Atrial septum: No defect or patent foramen ovale was identified. Impressions:  - Compared to the prior  study from 05/07/2013 LV EF is mildly improved, currently 50-55%.    Assessment & Plan  1. Acute systolic congestive heart failure -  The patient is euvolemic, we will continue the same regimen with Lasix 40 mg orally daily and spironolactone 25 mg daily, we will check BMP today. Recent echocardiogram shows improvement of LV EF from 35% to 50-55%.  He is almost back to baseline, still in cardiac rehab till March. He started a nutrition program as has lost 12 lbs.   2. 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 Atrial flutter currently in sinus rhythm following cardioversion - patient is in sinus rhythm, we will continue beta blockers and stop Xarelto.   4. Type 2 diabetes mellitus - HbA1c 8.6%, he will follow with his PCP.  5. Morbid obesity - the patient changed his diet and started to exercise slowly and he feels he already lost some weight  6. Depression, anxiety - feeling much better, he is going to see a psychiatrist.   Followup in 2 months   Tobias AlexanderNELSON, Edom Schmuhl, Rexene EdisonH, MD 07/29/2013, 10:22 AM

## 2013-07-30 ENCOUNTER — Telehealth: Payer: Self-pay | Admitting: *Deleted

## 2013-07-30 MED ORDER — FUROSEMIDE 20 MG PO TABS: 20.0000 mg | ORAL_TABLET | Freq: Every day | ORAL | Status: DC

## 2013-07-30 MED ORDER — FUROSEMIDE 40 MG PO TABS: 40.0000 mg | ORAL_TABLET | Freq: Every day | ORAL | Status: DC

## 2013-07-30 MED ORDER — POTASSIUM CHLORIDE ER 10 MEQ PO TBCR: 10.0000 meq | EXTENDED_RELEASE_TABLET | Freq: Every day | ORAL | Status: AC

## 2013-07-30 NOTE — Telephone Encounter (Signed)
Adam Lynn, Thank yo so much for catching my mistake. It will be 40 mg po daily instead of BID. Thank you, Aris Lot

## 2013-07-30 NOTE — Telephone Encounter (Signed)
Clarification for Lasix dose change. Lasix will now be 40 mg by mouth daily and KCl 10 mEq by mouth daily.  Spoke to patient and explained to him regarding the clarification for medication changes. He verbalized agreement and understanding of the new changes.

## 2013-07-30 NOTE — Telephone Encounter (Signed)
Patient notified of lab results from 07/29/13.  Advised patient that Dr. Delton See requesting for him to decrease Lasix from 40 mg to 20 mg by mouth daily and to decrease KCl from 20 mEq to 10 mEq by mouth daily. Patient verbalized understanding and agreement with current treatment plan. Advised patient to call office back should he have any questions or concerns.

## 2013-07-30 NOTE — Progress Notes (Signed)
Quick Note:  Patient notified of lab results from 07/29/13. Advised patient that Dr. Delton See requesting for him to decrease Lasix from 40 mg to 20 mg by mouth daily and to decrease KCl from 20 mEq to 10 mEq by mouth daily. Patient verbalized understanding and agreement with current treatment plan. Advised patient to call office back should he have any questions or concerns. ______

## 2013-07-31 ENCOUNTER — Encounter (HOSPITAL_COMMUNITY)
Admission: RE | Admit: 2013-07-31 | Discharge: 2013-07-31 | Disposition: A | Discharge status: Home / Self Care | Payer: Medicare Other | Source: Ambulatory Visit | Attending: Cardiology | Admitting: Cardiology

## 2013-07-31 LAB — GLUCOSE, CAPILLARY
Glucose-Capillary: 200 mg/dL — ABNORMAL HIGH (ref 70–99)
Glucose-Capillary: 89 mg/dL (ref 70–99)

## 2013-07-31 NOTE — Progress Notes (Signed)
Pt arrived at cardiac rehab reporting medication changes per Dr. Delton See.  Pt states he has stopped Xarelto and decreased lasix 40mg  once daily and potassium once daily.  Med list reconciled.

## 2013-07-31 NOTE — Progress Notes (Signed)
PC receieved from [facility] reporting  pt has scheduled new patient appointment for 08/20/2013 @ 3:30pm.  Also, pt is interested in participating in [place] program, however he has not enrolled in classes as previously reported.   Unfortunately, pt has missed enrollment period for January at [facility] on Tues/Thurs which coordinates with pt day off at work.   Awaiting call back from [facility] coordinator to enroll pt in next scheduled session.

## 2013-08-03 ENCOUNTER — Encounter (HOSPITAL_COMMUNITY)
Admission: RE | Admit: 2013-08-03 | Discharge: 2013-08-03 | Disposition: A | Discharge status: Home / Self Care | Payer: Medicare Other | Source: Ambulatory Visit | Attending: Cardiology | Admitting: Cardiology

## 2013-08-03 LAB — GLUCOSE, CAPILLARY
Glucose-Capillary: 150 mg/dL — ABNORMAL HIGH (ref 70–99)
Glucose-Capillary: 105 mg/dL — ABNORMAL HIGH (ref 70–99)

## 2013-08-05 ENCOUNTER — Encounter (HOSPITAL_COMMUNITY): Payer: Self-pay

## 2013-08-07 ENCOUNTER — Encounter (HOSPITAL_COMMUNITY)
Admission: RE | Admit: 2013-08-07 | Discharge: 2013-08-07 | Disposition: A | Discharge status: Home / Self Care | Payer: Medicare Other | Source: Ambulatory Visit | Attending: Cardiology | Admitting: Cardiology

## 2013-08-07 LAB — GLUCOSE, CAPILLARY
Glucose-Capillary: 169 mg/dL — ABNORMAL HIGH (ref 70–99)
Glucose-Capillary: 124 mg/dL — ABNORMAL HIGH (ref 70–99)

## 2013-08-07 NOTE — Progress Notes (Signed)
I have reviewed home exercise with [place]. The patient was advised to walk 2-4 days per week for 10 minutes, 3 times per day, progressing to 15 minutes, 2 times per day until he can walk 30 minutes continuously.  Pt stated he is currently walking for approximately 10 minutes per day on days outside of CRP II.  Pt will also complete one additional day of hand weights outside of CRP II.  Progression of exercise prescription was discussed.  Reviewed THR, pulse, RPE, sign and symptoms and when to call 911 or MD.  Pt voiced understanding.  Alexia Freestone, MS, ACSM RCEP 08/07/2013 9:12 AM

## 2013-08-10 ENCOUNTER — Encounter (HOSPITAL_COMMUNITY)
Admission: RE | Admit: 2013-08-10 | Discharge: 2013-08-10 | Disposition: A | Discharge status: Home / Self Care | Payer: Medicare Other | Source: Ambulatory Visit | Attending: Cardiology | Admitting: Cardiology

## 2013-08-10 LAB — GLUCOSE, CAPILLARY
Glucose-Capillary: 149 mg/dL — ABNORMAL HIGH (ref 70–99)
Glucose-Capillary: 129 mg/dL — ABNORMAL HIGH (ref 70–99)

## 2013-08-12 ENCOUNTER — Encounter (HOSPITAL_COMMUNITY)
Admission: RE | Admit: 2013-08-12 | Discharge: 2013-08-12 | Disposition: A | Discharge status: Home / Self Care | Payer: Medicare Other | Source: Ambulatory Visit | Attending: Cardiology | Admitting: Cardiology

## 2013-08-12 LAB — GLUCOSE, CAPILLARY: Glucose-Capillary: 147 mg/dL — ABNORMAL HIGH (ref 70–99)

## 2013-08-12 NOTE — Progress Notes (Signed)
Pt reported at cardiac rehab he misunderstood instructions to decrease lasix to 40mg  1 tablet daily.  He mistakenly thought he should cut tablet in half, therefore took lasix 20mg  once daily for approximately 1 week.  Pt instructed to take lasix 40mg  daily.  Pt brought his pill bottles to confirm.  Written and verbal instructions given.  Pt also requesting a prescription for chantix.  And would like to have his prescription for xanax transferred to the community pharmacy.  I will contact his physicians for this request.  Understanding verbalized

## 2013-08-13 ENCOUNTER — Telehealth: Payer: Self-pay

## 2013-08-13 MED ORDER — VARENICLINE TARTRATE 0.5 MG X 11 & 1 MG X 42 PO MISC: ORAL | Status: DC

## 2013-08-13 MED ORDER — VARENICLINE TARTRATE 1 MG PO TABS: 1.0000 mg | ORAL_TABLET | Freq: Two times a day (BID) | ORAL | Status: DC

## 2013-08-13 NOTE — Telephone Encounter (Signed)
**Note De-Identified Joette Schmoker Obfuscation** Message copied by Tzirel Leonor, Lorelle Formosa on Thu Aug 13, 2013  7:28 AM ------      Message from: Lars Masson      Created: Wed Aug 12, 2013  5:26 PM      Regarding: FW: Cardiac Rehab       Larita Fife,      Could you prescribe him Chantix 1 mg daily?      Thank you,      Aris Lot            ----- Message -----         From: Robyne Peers, RN         Sent: 08/12/2013   2:43 PM           To: Lars Masson, MD      Subject: Cardiac Rehab                                            Dear Dr. Delton See,      Pt expressed desire to stop smoking and requests prescription for chantix.  Would you be able to prescribe this him?  If so, please send rx to the [facility].              Also, he has a refill of xanax but he would like to have this prescription transferred to the [facility] pharmacy.  You were the original prescriber.    However, I can contact his primary care MD for this request if you prefer.             Thank you,      Deveron Furlong, RN      Cardiac Pulmonary Rehab             ------

## 2013-08-13 NOTE — Telephone Encounter (Signed)
Message copied by VIA, Lorelle Formosa on Thu Aug 13, 2013  9:03 AM ------      Message from: Lars Masson      Created: Wed Aug 12, 2013  5:26 PM      Regarding: FW: Cardiac Rehab       Larita Fife,      Could you prescribe him Chantix 1 mg daily?      Thank you,      Aris Lot            ----- Message -----         From: Robyne Peers, RN         Sent: 08/12/2013   2:43 PM           To: Lars Masson, MD      Subject: Cardiac Rehab                                            Dear Dr. Delton See,      Pt expressed desire to stop smoking and requests prescription for chantix.  Would you be able to prescribe this him?  If so, please send rx to the [facility] pharmacy.              Also, he has a refill of xanax but he would like to have this prescription transferred to the [facility] pharmacy.  You were the original prescriber.    However, I can contact his primary care MD for this request if you prefer.             Thank you,      Deveron Furlong, RN      Cardiac Pulmonary Rehab             ------

## 2013-08-13 NOTE — Telephone Encounter (Signed)
Message copied by VIA, Lorelle Formosa on Thu Aug 13, 2013  9:08 AM ------      Message from: Lars Masson      Created: Wed Aug 12, 2013  5:26 PM      Regarding: FW: Cardiac Rehab       Larita Fife,      Could you prescribe him Chantix 1 mg daily?      Thank you,      Aris Lot            ----- Message -----         From: Robyne Peers, RN         Sent: 08/12/2013   2:43 PM           To: Lars Masson, MD      Subject: Cardiac Rehab                                            Dear Dr. Delton See,      Pt expressed desire to stop smoking and requests prescription for chantix.  Would you be able to prescribe this him?  If so, please send rx to the [facility].              Also, he has a refill of xanax but he would like to have this prescription transferred to the [facility] pharmacy.  You were the original prescriber.    However, I can contact his primary care MD for this request if you prefer.             Thank you,      Deveron Furlong, RN      Cardiac Pulmonary Rehab             ------

## 2013-08-13 NOTE — Telephone Encounter (Signed)
**Note De-Identified Adam Lynn Obfuscation** Left detailed message on pt's VM that his Chantix RX has been sent to his pharmacy to fill and that if he has any questions or concerns to call me back.

## 2013-08-14 ENCOUNTER — Encounter (HOSPITAL_COMMUNITY)
Admission: RE | Admit: 2013-08-14 | Discharge: 2013-08-14 | Disposition: A | Discharge status: Home / Self Care | Payer: Medicare Other | Source: Ambulatory Visit | Attending: Cardiology | Admitting: Cardiology

## 2013-08-14 ENCOUNTER — Other Ambulatory Visit: Payer: Self-pay | Admitting: Internal Medicine

## 2013-08-14 LAB — GLUCOSE, CAPILLARY: Glucose-Capillary: 131 mg/dL — ABNORMAL HIGH (ref 70–99)

## 2013-08-14 MED ORDER — VARENICLINE TARTRATE 0.5 MG X 11 & 1 MG X 42 PO MISC: ORAL | Status: DC

## 2013-08-14 MED ORDER — VARENICLINE TARTRATE 1 MG PO TABS: 1.0000 mg | ORAL_TABLET | Freq: Two times a day (BID) | ORAL | Status: DC

## 2013-08-14 NOTE — Progress Notes (Addendum)
Pt arrived at cardiac rehab reporting his weight is up today.  He denies shortness of breath or edema.  Lungs clear.  Pt admits to eating food with high sodium content, sausage and canned ham, that he was given at [employer].  Pt denies missed med doses.  Pt given information about [facility] that offers nutritious food pantry items.  Pt brought copy of letter confirming orientation at [facility] on 08/31/13 @845am .  Pt also states he has rescheduled his behavorial health appt from 08/20/13 to 09/28/13 due to out of town travel on the 29th.   Spoke to [employer] and [employer] regarding pt questions about chantix prescription.  Per pharmacy staff, pt needs to complete patient assistance application.  Pharmacist will contact pt to arrange this.  Lastly, pt requests refills of xanax be forwarded to community pharmacy.  Pt instructed will need to contact PCP for this request.   Pt informed, understanding verbalized

## 2013-08-17 ENCOUNTER — Encounter (HOSPITAL_COMMUNITY): Payer: Self-pay

## 2013-08-19 ENCOUNTER — Encounter (HOSPITAL_COMMUNITY)
Admission: RE | Admit: 2013-08-19 | Discharge: 2013-08-19 | Disposition: A | Discharge status: Home / Self Care | Payer: Medicare Other | Source: Ambulatory Visit | Attending: Cardiology | Admitting: Cardiology

## 2013-08-19 LAB — GLUCOSE, CAPILLARY
Glucose-Capillary: 118 mg/dL — ABNORMAL HIGH (ref 70–99)
Glucose-Capillary: 125 mg/dL — ABNORMAL HIGH (ref 70–99)

## 2013-08-20 ENCOUNTER — Ambulatory Visit (HOSPITAL_COMMUNITY): Payer: Self-pay | Admitting: Psychiatry

## 2013-08-21 ENCOUNTER — Encounter (HOSPITAL_COMMUNITY): Payer: Self-pay

## 2013-08-24 ENCOUNTER — Encounter (HOSPITAL_COMMUNITY): Payer: Self-pay

## 2013-08-25 ENCOUNTER — Telehealth (HOSPITAL_COMMUNITY): Payer: Self-pay | Admitting: Cardiac Rehabilitation

## 2013-08-25 NOTE — Telephone Encounter (Signed)
pc to pt to assess reason for absence from cardiac rehab. Pt states he was resting from his recent out of town travel.  Pt states he also missed his vocational rehab appointment however he called voc rehab to r/s.  Pt states he will plan to return to exercise on Wed 08/26/13.

## 2013-08-26 ENCOUNTER — Encounter (HOSPITAL_COMMUNITY)
Admission: RE | Admit: 2013-08-26 | Discharge: 2013-08-26 | Disposition: A | Discharge status: Home / Self Care | Payer: Medicare Other | Source: Ambulatory Visit | Attending: Cardiology | Admitting: Cardiology

## 2013-08-26 ENCOUNTER — Telehealth: Payer: Self-pay | Admitting: Cardiology

## 2013-08-26 ENCOUNTER — Telehealth (HOSPITAL_COMMUNITY): Payer: Self-pay | Admitting: Cardiac Rehabilitation

## 2013-08-26 DIAGNOSIS — I110 Hypertensive heart disease with heart failure: Secondary | ICD-10-CM | POA: Insufficient documentation

## 2013-08-26 DIAGNOSIS — F17200 Nicotine dependence, unspecified, uncomplicated: Secondary | ICD-10-CM | POA: Insufficient documentation

## 2013-08-26 DIAGNOSIS — I509 Heart failure, unspecified: Secondary | ICD-10-CM | POA: Insufficient documentation

## 2013-08-26 DIAGNOSIS — E119 Type 2 diabetes mellitus without complications: Secondary | ICD-10-CM | POA: Insufficient documentation

## 2013-08-26 DIAGNOSIS — I2789 Other specified pulmonary heart diseases: Secondary | ICD-10-CM | POA: Insufficient documentation

## 2013-08-26 DIAGNOSIS — Z7901 Long term (current) use of anticoagulants: Secondary | ICD-10-CM | POA: Insufficient documentation

## 2013-08-26 DIAGNOSIS — G4730 Sleep apnea, unspecified: Secondary | ICD-10-CM | POA: Insufficient documentation

## 2013-08-26 DIAGNOSIS — I428 Other cardiomyopathies: Secondary | ICD-10-CM | POA: Insufficient documentation

## 2013-08-26 DIAGNOSIS — Z5189 Encounter for other specified aftercare: Secondary | ICD-10-CM | POA: Insufficient documentation

## 2013-08-26 DIAGNOSIS — I4891 Unspecified atrial fibrillation: Secondary | ICD-10-CM | POA: Insufficient documentation

## 2013-08-26 DIAGNOSIS — I5022 Chronic systolic (congestive) heart failure: Secondary | ICD-10-CM | POA: Insufficient documentation

## 2013-08-26 DIAGNOSIS — I4892 Unspecified atrial flutter: Secondary | ICD-10-CM | POA: Insufficient documentation

## 2013-08-26 LAB — GLUCOSE, CAPILLARY
Glucose-Capillary: 186 mg/dL — ABNORMAL HIGH (ref 70–99)
Glucose-Capillary: 175 mg/dL — ABNORMAL HIGH (ref 70–99)

## 2013-08-26 NOTE — Progress Notes (Addendum)
Pt arrived at cardiac rehab c/o dizziness and feeling "off balance" with standing or moving.  Symptoms improved with sitting.  Pt also c/o sinus pressure for which he took zyrtec yesterday.   Pt denies nasal congestion, drainage or fever.  Pt also denies chest pain or dyspnea.  Pt lungs clear, trace pedal edema. Weight stable today. Orthostatic BP:  125/81, HR-88 lying, 115/79, HR-89 sitting, 108/76, HR-109, standing.  Phone call to Dr. Lindaann Slough nurse Ileene Hutchinson to report pt symptoms and VS readings. Nurse will review with Dr. Delton See and call pt at home.  Pt given gatorade and banana.  Pt did not exercise

## 2013-08-26 NOTE — Telephone Encounter (Signed)
[name] from cardiac rehab called regarding pt. Pt came to cardiac rehab this AM C/O of being off balance ,dizzy and light headed. Pt did not exercise today. [name] took orthostatic BP lying 125/80 sitting, 115/79 standing, 108/76. Pt has mild sinus congestion. [name] has giving pt some juice to drink. [name] wants to know what else MD would like to  Recommend.

## 2013-08-26 NOTE — Telephone Encounter (Signed)
Adam Lynn is advised, she verbalized understanding and states that she will notify the pt.

## 2013-08-26 NOTE — Telephone Encounter (Signed)
New message     Pt is dizzy and off balance---he cannot exercise--they will keep him there until someone calls.  Need advice.  Sent to triage because of faster call return requested.

## 2013-08-26 NOTE — Telephone Encounter (Signed)
Nothing more, symptoms are most probably secondary to upper respiratory infection. If hypotension persists, please call us back and we will see him in our clinic.

## 2013-08-26 NOTE — Telephone Encounter (Signed)
Adam Lynn is aware that Dr. Denna Haggard be in the office till 11:00 AM today. Adam Lynn state pt is a lot  better and feels fine to drive home. Pt is aware that MD's nurse  will call him at home with MD's recommendations.

## 2013-08-26 NOTE — Telephone Encounter (Signed)
pc received from Dr. Lindaann Slough nurse. Per Dr. Delton See, sx probably r/t sinus congestion, URI.  Pt to continue current regimen.  Pc to pt to advise.  Pt verbalized understanding and states he feels better since he had gatorade and banana. Pt also adds that he went by the Internal Medicine clinic to pick up his glucose test strips.

## 2013-08-26 NOTE — Telephone Encounter (Signed)
Please advise 

## 2013-08-28 ENCOUNTER — Encounter (HOSPITAL_COMMUNITY)
Admission: RE | Admit: 2013-08-28 | Discharge: 2013-08-28 | Disposition: A | Discharge status: Home / Self Care | Payer: Medicare Other | Source: Ambulatory Visit | Attending: Cardiology | Admitting: Cardiology

## 2013-08-28 ENCOUNTER — Other Ambulatory Visit: Payer: Self-pay | Admitting: Physician Assistant

## 2013-08-28 LAB — GLUCOSE, CAPILLARY
Glucose-Capillary: 165 mg/dL — ABNORMAL HIGH (ref 70–99)
Glucose-Capillary: 127 mg/dL — ABNORMAL HIGH (ref 70–99)

## 2013-08-31 ENCOUNTER — Encounter (HOSPITAL_COMMUNITY)
Admission: RE | Admit: 2013-08-31 | Discharge: 2013-08-31 | Disposition: A | Discharge status: Home / Self Care | Payer: Medicare Other | Source: Ambulatory Visit | Attending: Cardiology | Admitting: Cardiology

## 2013-08-31 LAB — GLUCOSE, CAPILLARY
Glucose-Capillary: 185 mg/dL — ABNORMAL HIGH (ref 70–99)
Glucose-Capillary: 116 mg/dL — ABNORMAL HIGH (ref 70–99)

## 2013-09-02 ENCOUNTER — Encounter (HOSPITAL_COMMUNITY)
Admission: RE | Admit: 2013-09-02 | Discharge: 2013-09-02 | Disposition: A | Discharge status: Home / Self Care | Payer: Medicare Other | Source: Ambulatory Visit | Attending: Cardiology | Admitting: Cardiology

## 2013-09-02 LAB — GLUCOSE, CAPILLARY
Glucose-Capillary: 101 mg/dL — ABNORMAL HIGH (ref 70–99)
Glucose-Capillary: 111 mg/dL — ABNORMAL HIGH (ref 70–99)

## 2013-09-04 ENCOUNTER — Encounter (HOSPITAL_COMMUNITY)
Admission: RE | Admit: 2013-09-04 | Discharge: 2013-09-04 | Disposition: A | Discharge status: Home / Self Care | Payer: Medicare Other | Source: Ambulatory Visit | Attending: Cardiology | Admitting: Cardiology

## 2013-09-04 ENCOUNTER — Other Ambulatory Visit: Payer: Self-pay | Admitting: *Deleted

## 2013-09-04 ENCOUNTER — Other Ambulatory Visit: Payer: Self-pay | Admitting: Physician Assistant

## 2013-09-04 LAB — GLUCOSE, CAPILLARY
Glucose-Capillary: 133 mg/dL — ABNORMAL HIGH (ref 70–99)
Glucose-Capillary: 89 mg/dL (ref 70–99)

## 2013-09-04 MED ORDER — HYDRALAZINE HCL 25 MG PO TABS: 25.0000 mg | ORAL_TABLET | Freq: Three times a day (TID) | ORAL | Status: DC

## 2013-09-07 ENCOUNTER — Telehealth (HOSPITAL_COMMUNITY): Payer: Self-pay | Admitting: Cardiac Rehabilitation

## 2013-09-07 ENCOUNTER — Encounter (HOSPITAL_COMMUNITY)
Admission: RE | Admit: 2013-09-07 | Discharge: 2013-09-07 | Disposition: A | Discharge status: Home / Self Care | Payer: Medicare Other | Source: Ambulatory Visit | Attending: Cardiology | Admitting: Cardiology

## 2013-09-07 LAB — GLUCOSE, CAPILLARY
Glucose-Capillary: 214 mg/dL — ABNORMAL HIGH (ref 70–99)
Glucose-Capillary: 109 mg/dL — ABNORMAL HIGH (ref 70–99)

## 2013-09-07 NOTE — Telephone Encounter (Signed)
Message copied by Robyne Peers on Mon Sep 07, 2013 12:15 PM ------      Message from: Lars Masson      Created: Mon Sep 07, 2013 11:55 AM      Regarding: RE: Cardiac Rehab       No, I would discontinue HCTZ.      Thank you,      Aris Lot      ----- Message -----         From: Robyne Peers, RN         Sent: 09/07/2013  11:35 AM           To: Lars Masson, MD      Subject: Cardiac Rehab                                            Dear Dr. Delton See,            I reconciled pt medications today and realized he is taking HCTZ 25mg  daily and lasix 40mg  daily, in addition to spironalctone 25mg  daily.  HCTZ was discontinued on hospital discharge however pt requested PCP to refill for him. Do you wish for him to continue HCTZ?            Thank you,      Deveron Furlong, RN      Cardiac Pulmonary Rehab             ------

## 2013-09-07 NOTE — Progress Notes (Signed)
Pt arrived at cardiac rehab concerned about minimal weight gain over past 3 days. Pt reports he has eaten high salt snacks and sweets but has avoided processed meats.  Pt weight actually only up 2lb according to home scales and 1lb according to cardiac rehab scales. Pt lungs clear, no edema present however abdomen is tight and distended. Pt c/o constipation.  After reviewing pt medication list, it appears he is taking HCTZ and Lasix, which he states he has been taking since December.  Pt instructed to call cardiac rehab once home to verify with his medication bottles. Pt did indeed confirm that he is taking both diuretics.  Dr. Delton See notified, awaiting clarification.  Pt instructed to continue current regimen until further advised.  Instructed to follow sodium restrictions closely and continue daily weights and call office for weight gain >3 lb in 1 day or >5 lb in 1 week.  Understanding verbalized

## 2013-09-07 NOTE — Telephone Encounter (Signed)
pc to pt to advise of new order per Dr. Delton See, left message on voice mail to return call.   Addendum:  Pt returned call.  He was advised to stop HCTZ.  Understanding verbalized

## 2013-09-09 ENCOUNTER — Telehealth: Payer: Self-pay | Admitting: Cardiology

## 2013-09-09 ENCOUNTER — Encounter (HOSPITAL_COMMUNITY)
Admission: RE | Admit: 2013-09-09 | Discharge: 2013-09-09 | Disposition: A | Discharge status: Home / Self Care | Payer: Medicare Other | Source: Ambulatory Visit | Attending: Cardiology | Admitting: Cardiology

## 2013-09-09 LAB — GLUCOSE, CAPILLARY
Glucose-Capillary: 165 mg/dL — ABNORMAL HIGH (ref 70–99)
Glucose-Capillary: 96 mg/dL (ref 70–99)

## 2013-09-09 NOTE — Telephone Encounter (Signed)
Pt states he went to Cardiac Rehab today--he did complete rehab without problems.

## 2013-09-09 NOTE — Telephone Encounter (Signed)
New Message  Pt has had a 7 lb weight gain over the past few week/// Complaints of abdominal bloating and abdomen is distended/// Please call back to discuss.

## 2013-09-09 NOTE — Telephone Encounter (Signed)
Pt states he was told his lungs were clear, no SOB, his weight was up 1 pound from Monday. Some abdominal distention was noted. Pt feels his may be a little constipated. Pt advised to try Miralax as needed /call PCP for further recommendations. Will forward to Dr Delton See

## 2013-09-09 NOTE — Progress Notes (Signed)
(  late entry11:00am) Pt arrived at cardiac rehab today c/o abdominal bloating and distention associated with constipation. Pt reports home weight up 7lb today from yesterday, however cardiac rehab weight gain 1kg in 2 days, 7 lbs over past 2 weeks.  Pt weight has been up and down for past few weeks. Pt does admit to eating foods with high sodium content.  Pt denies missed medication doses and also reports he had previously stopped taking HCTZ " a while back" and recently only taking lasix 40mg  daily as prescribed.  Pt reports daily BM, however not satisfactory results, with continued feelings of constipation.  Pt states he plans to take magnesium citrate today for relief.  Pt advised against using mag citrate. lungs clear, no peripheral edema noted, abdomen firm, distended, Bowel sounds- present all 4 quadrants, hyperactive lower quadrants.   Phone call to Dr. Lindaann Slough office to advise of pt weight and abdominal distention.  Left message for triage nurse.pt instructed to continue daily weights and follow sodium restrictions closely.-jrion,rn  Addendum:  (445pm)  Phone call to patient follow up on above.  Pt states he did have bowel movement without use of laxative with some relief from abdominal distention. Pt states he did speak with Dr. Lindaann Slough nurse, no change in current regimen.  Pt also states he was involved in traffic citation this AM which has affected his driving privileges.  Therefore, he will need to rely on public transportation to come to cardiac rehab.  Pt requests to change to 11:15 class time to coordinate his transportation and work schedule.  Pt offered emotional reassurance and support.

## 2013-09-09 NOTE — Telephone Encounter (Signed)
LMTCB

## 2013-09-11 ENCOUNTER — Encounter (HOSPITAL_COMMUNITY): Payer: Self-pay

## 2013-09-14 ENCOUNTER — Encounter (HOSPITAL_COMMUNITY)
Admission: RE | Admit: 2013-09-14 | Discharge: 2013-09-14 | Disposition: A | Discharge status: Home / Self Care | Payer: Medicare Other | Source: Ambulatory Visit | Attending: Cardiology | Admitting: Cardiology

## 2013-09-14 LAB — GLUCOSE, CAPILLARY
Glucose-Capillary: 151 mg/dL — ABNORMAL HIGH (ref 70–99)
Glucose-Capillary: 112 mg/dL — ABNORMAL HIGH (ref 70–99)

## 2013-09-16 ENCOUNTER — Encounter (HOSPITAL_COMMUNITY)
Admission: RE | Admit: 2013-09-16 | Discharge: 2013-09-16 | Disposition: A | Discharge status: Home / Self Care | Payer: Medicare Other | Source: Ambulatory Visit | Attending: Cardiology | Admitting: Cardiology

## 2013-09-16 LAB — GLUCOSE, CAPILLARY
Glucose-Capillary: 129 mg/dL — ABNORMAL HIGH (ref 70–99)
Glucose-Capillary: 148 mg/dL — ABNORMAL HIGH (ref 70–99)

## 2013-09-16 NOTE — Progress Notes (Signed)
Pt arrived at cardiac rehab exhibiting anxiety.  Pt tearful. Pt had gotten lost finding way to department due to his change in transportation (riding the bus today).   Pt c/o fatigue and sinus congestion, denies decongestant use.  But able to exercise without diffculty.  Pt VSS, CBG stable. Pt offered emotional support and reassurance. Pt reports increased stress and anxiety r/t current situational stress.  Pt reminded of upcoming behavioral health appointment and encouraged to keep appointment as scheduled.  Pt assisted in finding way back to bus stop. Pt verbalized understanding.

## 2013-09-18 ENCOUNTER — Encounter (HOSPITAL_COMMUNITY): Admission: RE | Admit: 2013-09-18 | Payer: Self-pay | Source: Ambulatory Visit

## 2013-09-18 ENCOUNTER — Telehealth (HOSPITAL_COMMUNITY): Payer: Self-pay | Admitting: Cardiac Rehabilitation

## 2013-09-18 NOTE — Telephone Encounter (Signed)
pc received from pt will be absent from cardiac rehab today due to delayed bus service

## 2013-09-21 ENCOUNTER — Encounter (HOSPITAL_COMMUNITY)
Admission: RE | Admit: 2013-09-21 | Discharge: 2013-09-21 | Disposition: A | Discharge status: Home / Self Care | Payer: Medicare Other | Source: Ambulatory Visit | Attending: Cardiology | Admitting: Cardiology

## 2013-09-21 DIAGNOSIS — I428 Other cardiomyopathies: Secondary | ICD-10-CM | POA: Insufficient documentation

## 2013-09-21 DIAGNOSIS — F17200 Nicotine dependence, unspecified, uncomplicated: Secondary | ICD-10-CM | POA: Insufficient documentation

## 2013-09-21 DIAGNOSIS — I110 Hypertensive heart disease with heart failure: Secondary | ICD-10-CM | POA: Insufficient documentation

## 2013-09-21 DIAGNOSIS — I4892 Unspecified atrial flutter: Secondary | ICD-10-CM | POA: Insufficient documentation

## 2013-09-21 DIAGNOSIS — I509 Heart failure, unspecified: Secondary | ICD-10-CM | POA: Insufficient documentation

## 2013-09-21 DIAGNOSIS — Z5189 Encounter for other specified aftercare: Secondary | ICD-10-CM | POA: Insufficient documentation

## 2013-09-21 DIAGNOSIS — G4730 Sleep apnea, unspecified: Secondary | ICD-10-CM | POA: Insufficient documentation

## 2013-09-21 DIAGNOSIS — Z7901 Long term (current) use of anticoagulants: Secondary | ICD-10-CM | POA: Insufficient documentation

## 2013-09-21 DIAGNOSIS — E119 Type 2 diabetes mellitus without complications: Secondary | ICD-10-CM | POA: Insufficient documentation

## 2013-09-21 DIAGNOSIS — I4891 Unspecified atrial fibrillation: Secondary | ICD-10-CM | POA: Insufficient documentation

## 2013-09-21 DIAGNOSIS — I2789 Other specified pulmonary heart diseases: Secondary | ICD-10-CM | POA: Insufficient documentation

## 2013-09-21 DIAGNOSIS — I5022 Chronic systolic (congestive) heart failure: Secondary | ICD-10-CM | POA: Insufficient documentation

## 2013-09-21 LAB — GLUCOSE, CAPILLARY
Glucose-Capillary: 137 mg/dL — ABNORMAL HIGH (ref 70–99)
Glucose-Capillary: 244 mg/dL — ABNORMAL HIGH (ref 70–99)

## 2013-09-21 NOTE — Progress Notes (Signed)
Pt arrived at Cardiac rehab today reporting he would like to establish Vocational Rehab services in [facility] where he plans to relocate later this month.  Pt given contact name and phone number for Vocational Rehab [place], [phone].  Pt instructed will need to establish permanent residence prior to enrolling.  Pt also given contact information for community fitness center in [place], [place] of [facility] [phone] to continue his exercise routine.  Pt encouraged to keep behavioral health appointment as scheduled this week in [place].  Understanding verbalized

## 2013-09-23 ENCOUNTER — Ambulatory Visit: Payer: Self-pay | Admitting: Cardiology

## 2013-09-23 ENCOUNTER — Encounter (HOSPITAL_COMMUNITY)
Admission: RE | Admit: 2013-09-23 | Discharge: 2013-09-23 | Disposition: A | Discharge status: Home / Self Care | Payer: Medicare Other | Source: Ambulatory Visit | Attending: Cardiology | Admitting: Cardiology

## 2013-09-23 LAB — GLUCOSE, CAPILLARY: Glucose-Capillary: 179 mg/dL — ABNORMAL HIGH (ref 70–99)

## 2013-09-23 NOTE — Progress Notes (Signed)
Pt arrived at cardiac rehab tearful and weak, presyncopal.  Pt had just attended meditation class.  Pt VSS, BP-140/72 CBG-179, pulse regular 80.  Pt reports increased situational stress and associated anxiety and depression.  Orthostatic VS obtained lying:   BP- 120/74 HR-88, sitting: 110/70 HR-80, standing:  118/74 HR-84. Pt given gatorade and banana.  Presyncopal symptoms resolved.   Pt has appointment with behavioral health 09/30/13.  Phone call to behavioral health to schedule work in appointment sooner.  However no available appointments.  Spoke to assessment counselor with [facility].  He identified patient to not be in crisis, not suicidal or homicidal,  however depressed and overwhelmed with situational stress.  Offered pt walk in clinic at [facility] for talk therapy. Written and verbal Clinic information given to pt as well as emergency assessment team phone number to call if crisis occurs at home.  Transportation arranged for patient.

## 2013-09-25 ENCOUNTER — Encounter (HOSPITAL_COMMUNITY)
Admission: RE | Admit: 2013-09-25 | Discharge: 2013-09-25 | Disposition: A | Discharge status: Home / Self Care | Payer: Medicare Other | Source: Ambulatory Visit | Attending: Cardiology | Admitting: Cardiology

## 2013-09-25 LAB — GLUCOSE, CAPILLARY
Glucose-Capillary: 100 mg/dL — ABNORMAL HIGH (ref 70–99)
Glucose-Capillary: 133 mg/dL — ABNORMAL HIGH (ref 70–99)

## 2013-09-25 NOTE — Progress Notes (Signed)
Pt arrived at cardiac rehab reporting he did seek behavioral health assessment at [facility] yesterday. Pt states relief of overwhelming symptoms today.   Pt has prescription for welbutrin SR 150mg  for pt reported depression and anxiety disorder.  Pt has followup appointments scheduled March 23 and April 6.  However pt is unable to afford prescription $16 at [place], [place] at [facility].  I called [facility] pharmacy to assess price, left voicemail for return call.  Pt unwilling to wait for return call to verify price at this pharmacy stating he would rather go there in person to check price.   Pt weight up 1kg over 2 days.  Pt denies dyspnea or edema.  Pt admits to eating 3 hot dogs yesterday.  Pt  CBG-100 upon arrival.  Pt given snack. Post exercise CBG-133.  Pt states he has food in his house.  Pt offered emotional support and reassurance.

## 2013-09-28 ENCOUNTER — Encounter: Payer: Self-pay | Admitting: Cardiology

## 2013-09-28 ENCOUNTER — Encounter (HOSPITAL_COMMUNITY)
Admission: RE | Admit: 2013-09-28 | Discharge: 2013-09-28 | Disposition: A | Discharge status: Home / Self Care | Payer: Medicare Other | Source: Ambulatory Visit | Attending: Cardiology | Admitting: Cardiology

## 2013-09-28 ENCOUNTER — Ambulatory Visit (INDEPENDENT_AMBULATORY_CARE_PROVIDER_SITE_OTHER): Payer: Self-pay | Admitting: Cardiology

## 2013-09-28 VITALS — BP 114/90 | HR 80 | Ht 76.5 in | Wt 313.8 lb

## 2013-09-28 DIAGNOSIS — E1149 Type 2 diabetes mellitus with other diabetic neurological complication: Secondary | ICD-10-CM

## 2013-09-28 DIAGNOSIS — E1142 Type 2 diabetes mellitus with diabetic polyneuropathy: Secondary | ICD-10-CM

## 2013-09-28 DIAGNOSIS — E1169 Type 2 diabetes mellitus with other specified complication: Secondary | ICD-10-CM

## 2013-09-28 DIAGNOSIS — N521 Erectile dysfunction due to diseases classified elsewhere: Secondary | ICD-10-CM

## 2013-09-28 DIAGNOSIS — I119 Hypertensive heart disease without heart failure: Secondary | ICD-10-CM

## 2013-09-28 DIAGNOSIS — N529 Male erectile dysfunction, unspecified: Secondary | ICD-10-CM

## 2013-09-28 DIAGNOSIS — E1140 Type 2 diabetes mellitus with diabetic neuropathy, unspecified: Secondary | ICD-10-CM | POA: Insufficient documentation

## 2013-09-28 LAB — BASIC METABOLIC PANEL
BUN: 10 mg/dL (ref 6–23)
CO2: 28 mEq/L (ref 19–32)
Calcium: 9.2 mg/dL (ref 8.4–10.5)
Chloride: 107 mEq/L (ref 96–112)
Creatinine, Ser: 1.2 mg/dL (ref 0.4–1.5)
GFR: 84.46 mL/min (ref >60.00)
Glucose, Bld: 119 mg/dL — ABNORMAL HIGH (ref 70–99)
Potassium: 3.9 mEq/L (ref 3.5–5.1)
Sodium: 141 mEq/L (ref 135–145)

## 2013-09-28 LAB — GLUCOSE, CAPILLARY
Glucose-Capillary: 117 mg/dL — ABNORMAL HIGH (ref 70–99)
Glucose-Capillary: 110 mg/dL — ABNORMAL HIGH (ref 70–99)

## 2013-09-28 MED ORDER — AZITHROMYCIN 500 MG PO TABS: ORAL_TABLET | ORAL | Status: DC

## 2013-09-28 MED ORDER — SILDENAFIL CITRATE 50 MG PO TABS: 50.0000 mg | ORAL_TABLET | Freq: Every day | ORAL | Status: DC | PRN

## 2013-09-28 MED ORDER — PREGABALIN 50 MG PO CAPS: 50.0000 mg | ORAL_CAPSULE | Freq: Three times a day (TID) | ORAL | Status: DC

## 2013-09-28 NOTE — Patient Instructions (Addendum)
Your physician has recommended you make the following change in your medication: start taking Azithromycin 500 mg 1 tablet daily for 5 days, Lyrica 50 mg twice daily and Viagra 50 mg as needed.  Your physician recommends that you return for lab work in: today   Your physician wants you to follow-up in: 6 months. You will receive a reminder letter in the mail two months in advance. If you don't receive a letter, please call our office to schedule the follow-up appointment.

## 2013-09-28 NOTE — Progress Notes (Signed)
Patient ID: Adam Lynn, male   DOB: June 25, 1956, 58 y.o.   MRN: [id number]  Patient ID: Adam Lynn, male   DOB: June 25, 1956, 58 y.o.   MRN: [id number]      Patient Name: Adam Lynn Date of Encounter: 09/28/2013  Primary Care Provider:  Jeanann LewandowskyJEGEDE, OLUGBEMIGA, MD Primary Cardiologist:  Lars MassonNELSON, Marlette Curvin H   Patient Profile  Follow up after hospital visit  Problem List   Past Medical History  Diagnosis Date  . Hypertension   . Atrial flutter with rapid ventricular response   . Acute on chronic systolic congestive heart failure     EF: 35% on 05/10/13  . Shortness of breath   . Sleep apnea   . Diabetes mellitus   . GERD (gastroesophageal reflux disease)   . Acute on chronic systolic congestive heart failure 05/10/2013  . Depression   . Anxiety    Past Surgical History  Procedure Laterality Date  . Cardiac catheterization    . Tonsillectomy    . Tee without cardioversion Left 05/07/2013    Procedure: TRANSESOPHAGEAL ECHOCARDIOGRAM (TEE);  Surgeon: Lars MassonKatarina H Trip Cavanagh, MD;  [facility];  Service: Cardiovascular;  Laterality: Left;  . Cardioversion Left 05/07/2013    Procedure: CARDIOVERSION;  Surgeon: Lars MassonKatarina H Denean Pavon, MD;  [facility];  Service: Cardiovascular;  Laterality: Left;    Allergies  Allergies  Allergen Reactions  . Penicillins Rash    HPI  58 year old male with a history of diabetes, hypertension, who was recently hospitalized with acute combined systolic and diastolic heart failure with LV ejection fraction 30-35%. The patient was found to be in atrial flutter with 2 to one block, he was cardioverted back to the sinus rhythm and was treated for significant fluid overload. The patient responded to diuretics very well and was started on ACE inhibitor, beta blocker, as well as the spironolactone.  Echocardiogram showed mildly dilated left ventricle with moderately decreased LV function EF 30-35%, left atrium is moderately dilated, right ventricle is  dilated with moderately decreased function, and mild pulmonary hypertension. He also had grades 2 diastolic dysfunction.  After 1 month the patient felt significantly better. No lower extremity edema, no PND or orthopnea, DOE on moderate exercise such as walking flight of stairs. He complains of SOB at the work environment where he is exposed to tobacco dust and chemicals.    Today he is feeling back to baseline, he is involved in cardiac rehab, full of energy. He will be relocating to [place] in this months. He will still follow with us. No CP, SOB, no LE edema. He has a new girlfriend and is inquiring about sildenafil. He is complaining of sinus drainage and pain, no fever. He started a nutrition progream as has lost 12 lbs.   Home Medications  Prior to Admission medications   Medication Sig Start Date End Date Taking? Authorizing Provider  aspirin EC 81 MG tablet Take 81 mg by mouth daily.   Yes Historical Provider, MD  furosemide (LASIX) 40 MG tablet Take 1 tablet (40 mg total) by mouth 2 (two) times daily. 05/12/13  Yes Rhonda G Barrett, PA-C  guaiFENesin (MUCINEX) 600 MG 12 hr tablet Take 600 mg by mouth 2 (two) times daily.   Yes Historical Provider, MD  hydrALAZINE (APRESOLINE) 25 MG tablet Take 1 tablet (25 mg total) by mouth 3 (three) times daily with meals. 05/12/13  Yes Rhonda G Barrett, PA-C  lisinopril (PRINIVIL,ZESTRIL) 40 MG tablet Take 1 tablet (40 mg total) by mouth  daily. 05/12/13  Yes Rhonda G Barrett, PA-C  metFORMIN (GLUCOPHAGE) 1000 MG tablet Take 1,000 mg by mouth 2 (two) times daily with a meal.   Yes Historical Provider, MD  metoprolol succinate (TOPROL-XL) 25 MG 24 hr tablet Take 25 mg by mouth every morning. 05/12/13  Yes Rhonda G Barrett, PA-C  potassium chloride SA (K-DUR,KLOR-CON) 20 MEQ tablet Take 1 tablet (20 mEq total) by mouth 2 (two) times daily. 05/19/13  Yes Dorothea Ogle, MD  Rivaroxaban (XARELTO) 20 MG TABS tablet Take 1 tablet (20 mg total) by mouth daily.  05/19/13  Yes Dorothea Ogle, MD  sitaGLIPtin (JANUVIA) 50 MG tablet Take 50 mg by mouth daily.   Yes Historical Provider, MD  spironolactone (ALDACTONE) 25 MG tablet Take 1 tablet (25 mg total) by mouth daily. 05/12/13  Yes Rhonda G Barrett, PA-C    Family History  No family history on file.  Social History  History   Social History  . Marital Status: Single    Spouse Name: N/A    Number of Children: N/A  . Years of Education: N/A   Occupational History  . Not on file.   Social History Main Topics  . Smoking status: Current Every Day Smoker -- 1.00 packs/day for 33 years    Types: Cigarettes  . Smokeless tobacco: Never Used  . Alcohol Use: No  . Drug Use: No  . Sexual Activity: Yes   Other Topics Concern  . Not on file   Social History Narrative  . No narrative on file     Review of Systems, as per history of present illness otherwise negative General:  No chills, fever, night sweats or weight changes.  Cardiovascular:  No chest pain, dyspnea on exertion, edema, orthopnea, palpitations, paroxysmal nocturnal dyspnea. Dermatological: No rash, lesions/masses Respiratory: No cough, dyspnea Urologic: No hematuria, dysuria Abdominal:   No nausea, vomiting, diarrhea, bright red blood per rectum, melena, or hematemesis Neurologic:  No visual changes, wkns, changes in mental status. All other systems reviewed and are otherwise negative except as noted above.  Physical Exam  Blood pressure 114/90, pulse 80, height 6' 4.5" (1.943 m), weight 313 lb 12.8 oz (142.339 kg).  General: Pleasant, NAD Psych: Normal affect. Neuro: Alert and oriented X 3. Moves all extremities spontaneously. HEENT: Normal  Neck: Supple without bruits or JVD. Lungs:  Resp regular and unlabored, CTA. Heart: RRR no s3, s4, or murmurs. Abdomen: Soft, non-tender, non-distended, BS + x 4.  Extremities: No clubbing, cyanosis or edema. DP/PT/Radials 2+ and equal bilaterally.  Accessory Clinical  Findings  ECG - sinus rhythm, 86 beats per minute, right bundle branch block, left anterior fascicular block, bifascicular block, abnormal EKG.  TTE 05/08/2013 - Left ventricle: The cavity size was mildly dilated. Wall thickness was increased in a pattern of moderate LVH. The estimated ejection fraction was 35%. Diffuse hypokinesis. Definity contrast was given. Features are consistent with a pseudonormal left ventricular filling pattern, with concomitant abnormal relaxation and increased filling pressure (grade 2 diastolic dysfunction). - Aortic valve: Transvalvular velocity was within the normal range. There was no stenosis. - Aorta: Mildly dilated ascending aorta. Proximal ascending aorta measures 41 mm. Aortic root dimension: 71mm (ED). - Mitral valve: Trivial regurgitation. - Left atrium: The atrium was moderately dilated. - Right ventricle: The cavity size was mildly dilated. Systolic function was moderately reduced. - Right atrium: The atrium was moderately dilated. - Tricuspid valve: Peak RV-RA gradient: 36mm Hg (S). - Pulmonary arteries: PA systolic pressure 34-38  mmHg. - Systemic veins: IVC measured 2.4 cm with < 50% respirophasic variation, suggesting RA pressure 11-15 mmHg. Impressions:  - Mildly dilated LV with moderate LV hypertrophy. EF 35% with diffuse hypokinesis. Moderate diastolic dysfunction. Mildly dilated RV with moderately decreased systolic function. Mild pulmonary hypertension.  TTE 06/17/2013 Study Conclusions  - Left ventricle: The cavity size was normal. Wall thickness was normal. Systolic function was normal. The estimated ejection fraction was in the range of 50% to 55%. Wall motion was normal; there were no regional wall motion abnormalities. Doppler parameters are consistent with abnormal left ventricular relaxation (grade 1 diastolic dysfunction). - Left atrium: The atrium was mildly dilated. - Right atrium: The atrium was mildly dilated. -  Atrial septum: No defect or patent foramen ovale was identified. Impressions:  - Compared to the prior study from 05/07/2013 LV EF is mildly improved, currently 50-55%.    Assessment & Plan  1. Acute systolic congestive heart failure -  The patient is euvolemic, we will continue the same regimen with Lasix 40 mg orally daily and spironolactone 25 mg daily, we will check BMP today. Recent echocardiogram shows improvement of LV EF from 35% to 50-55%.  He is back to baseline, still in cardiac rehab till March. He started a nutrition program as has lost 12 lbs.  We will recheck BMP today (elevated Crea in January)  2. 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 Atrial flutter currently in sinus rhythm following cardioversion - patient is in sinus rhythm, we will continue beta blockers and stop Xarelto.   4. Type 2 diabetes mellitus - HbA1c 8.6%, he will follow with his PCP. Diabetic neuropathy, we will prescribe Lyrica 50 mg po TID.  5. Morbid obesity - the patient changed his diet and started to exercise slowly and he feels he already lost some weight  6. Depression, anxiety - feeling much better, prescribed Wellbutrin.  7. Smoking cessation - prescribed wellbutrin  8. ED - given prescription for 50 mg po daily PRN.   Followup in 6 months   Lars Masson, MD 09/28/2013, 9:35 AM

## 2013-09-30 ENCOUNTER — Ambulatory Visit (HOSPITAL_COMMUNITY): Payer: Self-pay | Admitting: Psychiatry

## 2013-09-30 ENCOUNTER — Encounter (HOSPITAL_COMMUNITY)
Admission: RE | Admit: 2013-09-30 | Discharge: 2013-09-30 | Disposition: A | Discharge status: Home / Self Care | Payer: Medicare Other | Source: Ambulatory Visit | Attending: Cardiology | Admitting: Cardiology

## 2013-09-30 LAB — GLUCOSE, CAPILLARY
Glucose-Capillary: 118 mg/dL — ABNORMAL HIGH (ref 70–99)
Glucose-Capillary: 70 mg/dL (ref 70–99)
Glucose-Capillary: 112 mg/dL — ABNORMAL HIGH (ref 70–99)
Glucose-Capillary: 66 mg/dL — ABNORMAL LOW (ref 70–99)

## 2013-09-30 MED ORDER — LISINOPRIL 40 MG PO TABS: ORAL_TABLET | ORAL | Status: AC

## 2013-09-30 MED ORDER — HYDRALAZINE HCL 50 MG PO TABS: 25.0000 mg | ORAL_TABLET | Freq: Three times a day (TID) | ORAL | Status: AC

## 2013-09-30 NOTE — Telephone Encounter (Signed)
New Message  Pt called. Requesting a call back to confirm that the statement was faxed to the Rehab clinic/// he is also requesting a 90 day for all medications. Please assist

## 2013-09-30 NOTE — Telephone Encounter (Signed)
Pt requesting 90 day supply of Lisinopril and Hydralazine be sent into pharmacy.  This will be done as requested.  Pt aware Lyn not here however paperwork would have already been signed and faxed to rehab clinic.  If they did not receive it they should call back.  Pt states understanding.

## 2013-09-30 NOTE — Telephone Encounter (Signed)
F/u ° ° ° °Pt returning a call from nurse. Please call pt.  °

## 2013-10-01 ENCOUNTER — Ambulatory Visit: Payer: Self-pay

## 2013-10-02 ENCOUNTER — Encounter (HOSPITAL_COMMUNITY)
Admission: RE | Admit: 2013-10-02 | Discharge: 2013-10-02 | Disposition: A | Discharge status: Home / Self Care | Payer: Medicare Other | Source: Ambulatory Visit | Attending: Cardiology | Admitting: Cardiology

## 2013-10-02 ENCOUNTER — Encounter (HOSPITAL_COMMUNITY): Payer: Medicare Other

## 2013-10-02 LAB — GLUCOSE, CAPILLARY
Glucose-Capillary: 113 mg/dL — ABNORMAL HIGH (ref 70–99)
Glucose-Capillary: 83 mg/dL (ref 70–99)

## 2013-10-02 NOTE — Progress Notes (Signed)
Pt arrived at cardiac rehab to exercise at 1:15 class.  Pt had not eaten today.  Pt was sent to cafeteria to eat lunch. Pt stayed to exercise at 2:45pm class.pt states he ate baked chicken, green beans mashed potatoes, macaroni and cheese with roll.   Pre exercise CBG-187. Post exercise-83.  Pt given peanut butter crackers and lemonade.   Given a banana to take home.  Recheck-113.  Pt states he has not filled his prescription for wellbutrin and new medication prescribed by Dr. Delton See due to financial hardship. Pt states he plans to pick up Monday. Pt also states he has set a quit date for smoking cessation however was not successful. Pt states his new quit date is March 30.  Pt was given information about [facility] including financial assistance application in [facility] and [employer] in [place].

## 2013-10-05 ENCOUNTER — Other Ambulatory Visit: Payer: Self-pay

## 2013-10-05 ENCOUNTER — Encounter: Payer: Self-pay | Admitting: Internal Medicine

## 2013-10-05 ENCOUNTER — Ambulatory Visit (HOSPITAL_COMMUNITY)
Admission: RE | Admit: 2013-10-05 | Discharge: 2013-10-05 | Disposition: A | Discharge status: Home / Self Care | Payer: Self-pay | Source: Ambulatory Visit | Attending: Internal Medicine | Admitting: Internal Medicine

## 2013-10-05 ENCOUNTER — Ambulatory Visit: Payer: Self-pay | Attending: Internal Medicine | Admitting: Internal Medicine

## 2013-10-05 ENCOUNTER — Encounter (HOSPITAL_COMMUNITY)
Admission: RE | Admit: 2013-10-05 | Discharge: 2013-10-05 | Disposition: A | Discharge status: Home / Self Care | Payer: Medicare Other | Source: Ambulatory Visit | Attending: Cardiology | Admitting: Cardiology

## 2013-10-05 VITALS — BP 131/92 | HR 81 | Temp 98.5°F | Resp 16 | Ht 76.0 in | Wt 313.2 lb

## 2013-10-05 DIAGNOSIS — E119 Type 2 diabetes mellitus without complications: Secondary | ICD-10-CM

## 2013-10-05 DIAGNOSIS — M79609 Pain in unspecified limb: Secondary | ICD-10-CM | POA: Insufficient documentation

## 2013-10-05 LAB — GLUCOSE, CAPILLARY
Glucose-Capillary: 187 mg/dL — ABNORMAL HIGH (ref 70–99)
Glucose-Capillary: 224 mg/dL — ABNORMAL HIGH (ref 70–99)
Glucose-Capillary: 108 mg/dL — ABNORMAL HIGH (ref 70–99)

## 2013-10-05 LAB — POCT GLYCOSYLATED HEMOGLOBIN (HGB A1C): Hemoglobin A1C: 6.1

## 2013-10-05 MED ORDER — PREGABALIN 50 MG PO CAPS: 50.0000 mg | ORAL_CAPSULE | Freq: Three times a day (TID) | ORAL | Status: DC
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
## 2013-10-05 NOTE — Progress Notes (Signed)
Follow up from last visit here States he is having some bilateral hand pain States he is unable to grip things  Feels as if he has neuropathy in hands and feet States he was checked out by Dr. Delton See and he is able to have Viagra

## 2013-10-06 ENCOUNTER — Telehealth: Payer: Self-pay | Admitting: Emergency Medicine

## 2013-10-06 NOTE — Telephone Encounter (Signed)
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

## 2013-10-06 NOTE — Telephone Encounter (Signed)
Left message for pt to call when message received 

## 2013-10-06 NOTE — Telephone Encounter (Signed)
Pt given xray results. Has appt with sports medicine

## 2013-10-07 ENCOUNTER — Encounter (HOSPITAL_COMMUNITY)
Admission: RE | Admit: 2013-10-07 | Discharge: 2013-10-07 | Disposition: A | Discharge status: Home / Self Care | Payer: Medicare Other | Source: Ambulatory Visit | Attending: Cardiology | Admitting: Cardiology

## 2013-10-07 LAB — GLUCOSE, CAPILLARY: Glucose-Capillary: 196 mg/dL — ABNORMAL HIGH (ref 70–99)

## 2013-10-08 ENCOUNTER — Telehealth: Payer: Self-pay | Admitting: Cardiology

## 2013-10-08 NOTE — Telephone Encounter (Signed)
Left message for pt to call.

## 2013-10-08 NOTE — Telephone Encounter (Signed)
Spoke with pt, [facility] at [phone].

## 2013-10-08 NOTE — Telephone Encounter (Signed)
New Prob     Requesting a written prescription for Viagra and Larica. Please call.

## 2013-10-09 ENCOUNTER — Encounter (HOSPITAL_COMMUNITY)
Admission: RE | Admit: 2013-10-09 | Discharge: 2013-10-09 | Disposition: A | Discharge status: Home / Self Care | Payer: Medicare Other | Source: Ambulatory Visit | Attending: Cardiology | Admitting: Cardiology

## 2013-10-09 LAB — GLUCOSE, CAPILLARY
Glucose-Capillary: 138 mg/dL — ABNORMAL HIGH (ref 70–99)
Glucose-Capillary: 107 mg/dL — ABNORMAL HIGH (ref 70–99)

## 2013-10-09 NOTE — Progress Notes (Signed)
Pt arrived at cardiac rehab reporting he started wellbutrin this week as prescribed by Mental Health a few weeks ago.  Medication list reconciled

## 2013-10-12 ENCOUNTER — Encounter (HOSPITAL_COMMUNITY)
Admission: RE | Admit: 2013-10-12 | Discharge: 2013-10-12 | Disposition: A | Discharge status: Home / Self Care | Payer: Medicare Other | Source: Ambulatory Visit | Attending: Cardiology | Admitting: Cardiology

## 2013-10-12 LAB — GLUCOSE, CAPILLARY
Glucose-Capillary: 198 mg/dL — ABNORMAL HIGH (ref 70–99)
Glucose-Capillary: 130 mg/dL — ABNORMAL HIGH (ref 70–99)

## 2013-10-14 ENCOUNTER — Encounter (HOSPITAL_COMMUNITY)
Admission: RE | Admit: 2013-10-14 | Discharge: 2013-10-14 | Disposition: A | Discharge status: Home / Self Care | Payer: Medicare Other | Source: Ambulatory Visit | Attending: Cardiology | Admitting: Cardiology

## 2013-10-14 ENCOUNTER — Other Ambulatory Visit: Payer: Self-pay | Admitting: Internal Medicine

## 2013-10-14 LAB — GLUCOSE, CAPILLARY: Glucose-Capillary: 164 mg/dL — ABNORMAL HIGH (ref 70–99)

## 2013-10-14 MED ORDER — PREGABALIN 50 MG PO CAPS: 50.0000 mg | ORAL_CAPSULE | Freq: Three times a day (TID) | ORAL | Status: AC

## 2013-10-14 MED ORDER — SILDENAFIL CITRATE 50 MG PO TABS: 50.0000 mg | ORAL_TABLET | Freq: Every day | ORAL | Status: AC | PRN

## 2013-10-14 NOTE — Progress Notes (Addendum)
Pt arrived at cardiac rehab early for 6:45am class.  Unfortunately, pt c/o vertigo symptoms, sinus congestion and abdominal gas from inconsistent bowel movement.    unable to exercise. VSS,138/86, CBG- 184. telemetry-NSR.  Pt reports he did not sleep well last night, ate sausage biscuit from [employer] and took his usual am meds today.  Pt given water and banana.  Pt able to walk track 5 laps without symptoms.  Repeat BP-128/80.  Pt states he has not taken the azithromycin as prescribed by Dr. Delton See earlier this month due to financial hardship.  Pt instructed to pick up medicine today and given resources to be able to do so.  Pt also instructed to take benadryl as directed.  Pt instructed to discuss knee pain and right hand concerns with sports medicine appt tommorrow.  Pt questions efficacy of chantix as he feels he has no will power to quit.  Pt instructed to continue as prescribed and discuss with mental health appointment tomorrow. Pt is interested in trying nicotine patches and states he has resources to obtain these.  Pt instructed again to call 1800quitnow for support and resources. Pt states he lost [facility]. New form given to patient.  Pt also given print out of current medications.  Pt requests application be faxed to [facility] in [place], however the application is not complete, financial information fields left blank.  Pt instructed to complete form in entirety prior to submitting.    Pt instructed to consume increased fiber in diet and perhaps use stool softener or miralax for regularity.   Understanding verbalized

## 2013-10-14 NOTE — Progress Notes (Signed)
(  late entry)  Reviewed CHF packet with pt.  Education specifically reinforced on importance of daily weights, monitoring daily symptoms, low sodium diet, taking medications as prescribed and exercise.  Teach back used.  Pt verbalized understanding. Pt states he has not stopped smoking however has quit date set for 10/16/13.  Pt is excited about upcoming move to [place] this week.  Pt given information and application for [facility] maintenance program in [place].  Understanding verbalized

## 2013-10-15 ENCOUNTER — Ambulatory Visit (INDEPENDENT_AMBULATORY_CARE_PROVIDER_SITE_OTHER): Payer: Self-pay | Admitting: Sports Medicine

## 2013-10-15 ENCOUNTER — Telehealth: Payer: Self-pay | Admitting: Cardiology

## 2013-10-15 ENCOUNTER — Encounter: Payer: Self-pay | Admitting: Sports Medicine

## 2013-10-15 VITALS — BP 146/106 | Ht 77.0 in | Wt 312.0 lb

## 2013-10-15 DIAGNOSIS — R29898 Other symptoms and signs involving the musculoskeletal system: Secondary | ICD-10-CM

## 2013-10-15 DIAGNOSIS — M542 Cervicalgia: Secondary | ICD-10-CM

## 2013-10-15 DIAGNOSIS — M6281 Muscle weakness (generalized): Secondary | ICD-10-CM

## 2013-10-15 NOTE — Telephone Encounter (Signed)
New message  Pt called. He requests to stop taking Wellbutrin and begin to use the patch to help him to quit smoking.. how long will it take to switch. He is requesting a call back to discuss
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
## 2013-10-15 NOTE — Telephone Encounter (Signed)
LVM for pt to return call. When pt returns call, they need to be made aware that they need to call PCP about stopping the med and starting the patch to quit smoking.

## 2013-10-15 NOTE — Patient Instructions (Signed)
You have been scheduled for an appointment for a MRI of your neck on 10/23/12 at 7:45 am  [facility] Please go to admitting to register  [phone]

## 2013-10-16 ENCOUNTER — Encounter (HOSPITAL_COMMUNITY)
Admission: RE | Admit: 2013-10-16 | Discharge: 2013-10-16 | Disposition: A | Discharge status: Home / Self Care | Payer: Medicare Other | Source: Ambulatory Visit | Attending: Cardiology | Admitting: Cardiology

## 2013-10-16 ENCOUNTER — Encounter (HOSPITAL_COMMUNITY): Payer: Self-pay

## 2013-10-16 NOTE — Progress Notes (Signed)
Pt graduated from cardiac rehab program today.  Medication list reconciled.  PHQ9 score-0.  Pt has made significant improvement with his mood, anxiety and depression with mental health therapy. Pt plans to transfer care to [place], [place], where he is moving today. Pt states he has financial ability to pick up all medications from pharmacy today before he leaves town.  Pt given verbal instruction of need for medications, medication list printed for patient. Pt has not quit smoking despite taking chantix, however has set today as his quit day.  Pt encouraged in this and again instructed to contact 1800quitnow for smoking cessation counseling and support.   Pt verbalized understanding of establishing with primary care once he moves.   Pt has made significant lifestyle changes and should be commended for his success. Pt plans to continue exercise in cardiac maintenance program in [place].  Pt has been given contact information and application form.

## 2013-10-19 ENCOUNTER — Encounter (HOSPITAL_COMMUNITY): Payer: Medicare Other

## 2013-10-19 LAB — GLUCOSE, CAPILLARY: Glucose-Capillary: 130 mg/dL — ABNORMAL HIGH (ref 70–99)

## 2013-10-21 ENCOUNTER — Encounter (HOSPITAL_COMMUNITY): Payer: Self-pay

## 2013-10-22 ENCOUNTER — Telehealth: Payer: Self-pay | Admitting: *Deleted

## 2013-10-22 NOTE — Telephone Encounter (Signed)
Pt is going to pick an imaging facility close to where he has moved, and let me know the information so I can fax MRI order.

## 2013-10-22 NOTE — Telephone Encounter (Signed)
Message copied by Mora Bellman on Thu Oct 22, 2013  1:42 PM ------      Message from: Lizbeth Bark      Created: Tue Oct 20, 2013 12:36 PM      Regarding: question      Contact: [phone]       Pt called stating he is now in [place] and would like to get his MRI there instead of coming back to [place]? ------

## 2013-10-23 ENCOUNTER — Ambulatory Visit (HOSPITAL_COMMUNITY): Admission: RE | Admit: 2013-10-23 | Payer: Self-pay | Source: Ambulatory Visit

## 2013-11-02 ENCOUNTER — Ambulatory Visit: Payer: Self-pay

## 2014-02-09 IMAGING — CR DG CHEST 2V
3 series · 3 of 3 positions shown · non-contrast
Comparison: None.

CLINICAL DATA: Productive cough with chest tightness for 4 days.
History of diabetes and hypertension.

EXAM:
CHEST  2 VIEW

[view not recorded (1 of 3)]
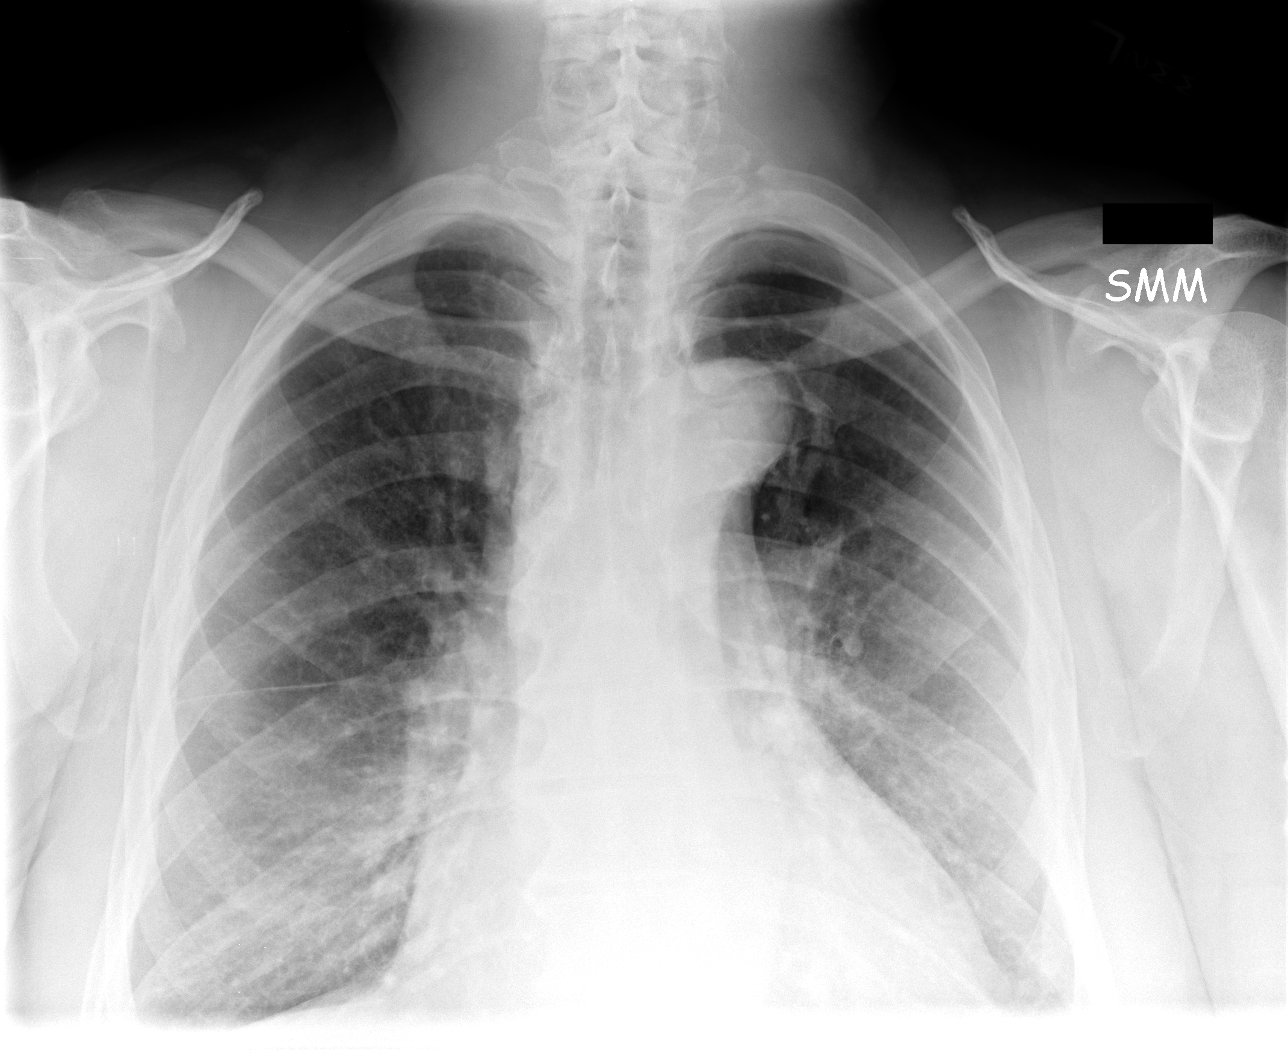

[view not recorded (2 of 3)]
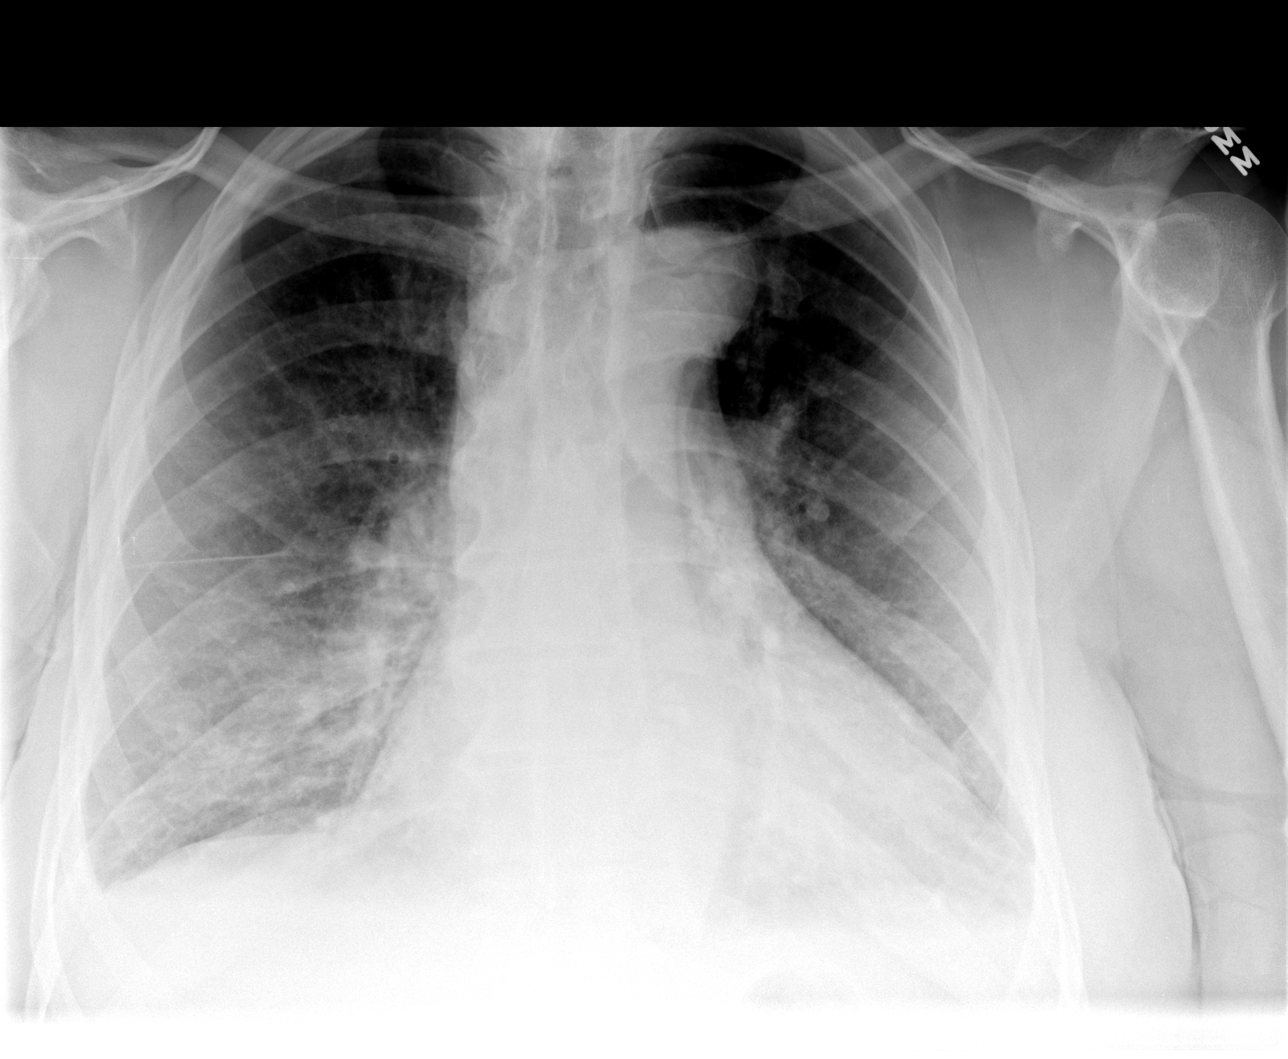

[view not recorded (3 of 3)]
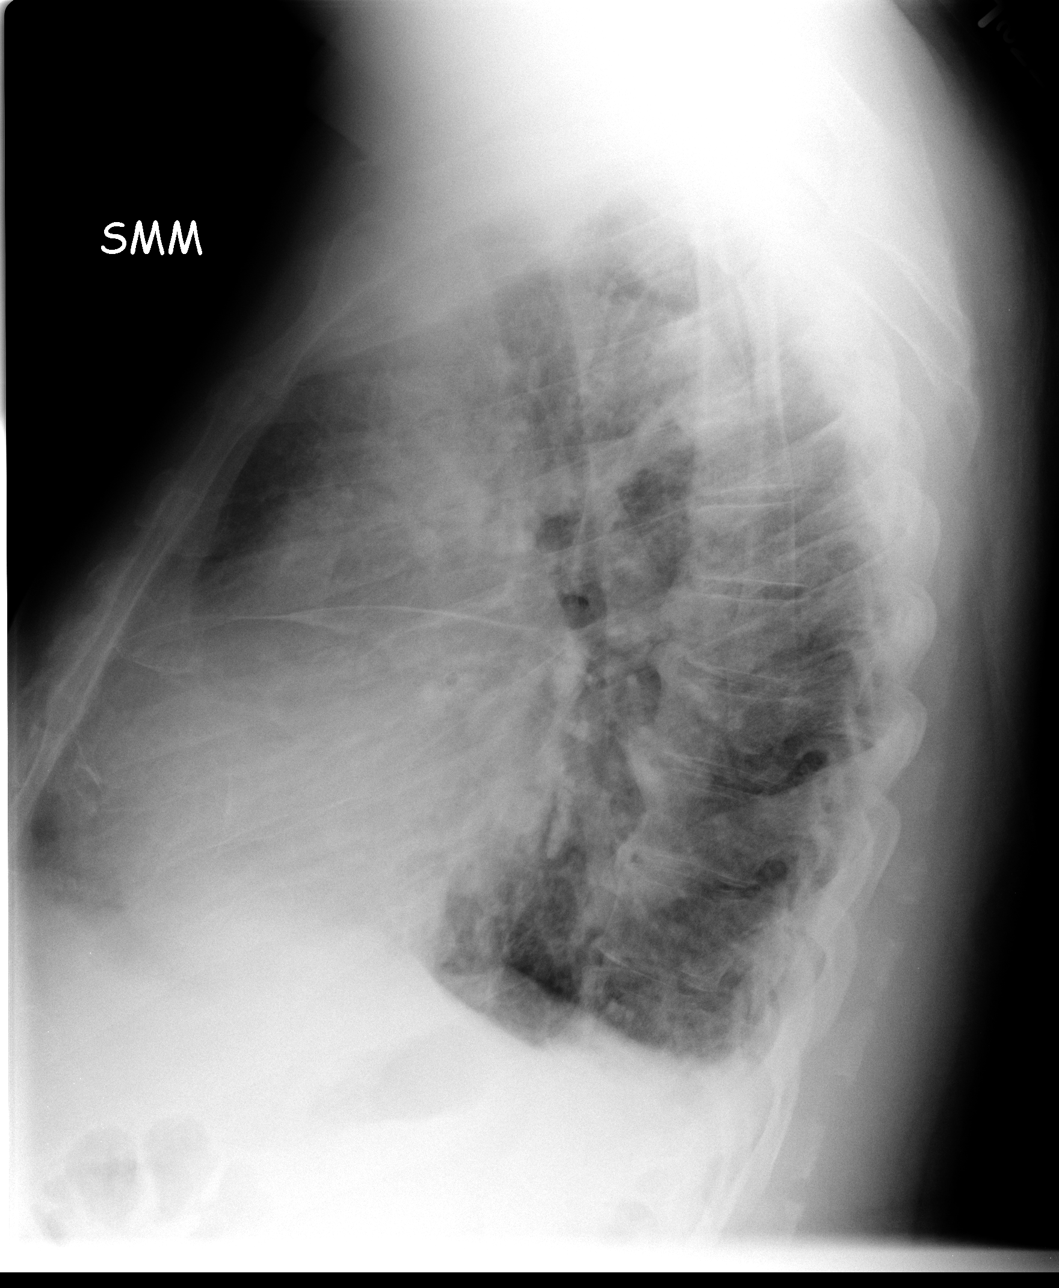

[3 of 3 positions shown; findings below may reference images not displayed]

FINDINGS: The heart is mildly enlarged. There is vascular congestion with
fissural thickening and small bilateral pleural effusions suspicious
for mild congestive heart failure. No confluent airspace opacity is
identified. There are degenerative changes throughout the spine.
IMPRESSION: Cardiomegaly with interstitial edema and small pleural effusions
suspicious for mild congestive heart failure.

## 2014-03-30 ENCOUNTER — Ambulatory Visit: Payer: Self-pay | Admitting: Cardiology

## 2014-03-30 ENCOUNTER — Encounter: Payer: Self-pay | Admitting: *Deleted

## 2014-04-02 ENCOUNTER — Encounter: Payer: Self-pay | Admitting: Cardiology

## 2014-04-02 ENCOUNTER — Ambulatory Visit (INDEPENDENT_AMBULATORY_CARE_PROVIDER_SITE_OTHER): Payer: Self-pay | Admitting: Cardiology

## 2014-04-02 VITALS — BP 130/84 | HR 77 | Ht 77.0 in | Wt 311.0 lb

## 2014-04-02 DIAGNOSIS — I509 Heart failure, unspecified: Secondary | ICD-10-CM

## 2014-04-02 DIAGNOSIS — I5023 Acute on chronic systolic (congestive) heart failure: Secondary | ICD-10-CM

## 2014-04-02 MED ORDER — FUROSEMIDE 40 MG PO TABS: 40.0000 mg | ORAL_TABLET | Freq: Every day | ORAL | Status: AC

## 2014-04-02 MED ORDER — BUPROPION HCL 100 MG PO TABS: 100.0000 mg | ORAL_TABLET | Freq: Every day | ORAL | Status: AC

## 2014-04-02 NOTE — Progress Notes (Signed)
Patient ID: Shailen Thielen, male   DOB: 27-Sep-1955, 58 y.o.   MRN: [id number]    Patient Name: Adam Lynn Date of Encounter: 04/02/2014  Primary Care Provider:  Jeanann Lewandowsky, MD Primary Cardiologist:  Lars Masson   Patient Profile  Follow up after hospital visit  Problem List   Past Medical History  Diagnosis Date  . Hypertension   . Atrial flutter with rapid ventricular response   . Acute on chronic systolic congestive heart failure     EF: 35% on 05/10/13  . Shortness of breath   . Sleep apnea   . Diabetes mellitus   . GERD (gastroesophageal reflux disease)   . Acute on chronic systolic congestive heart failure 05/10/2013  . Depression   . Anxiety    Past Surgical History  Procedure Laterality Date  . Cardiac catheterization    . Tonsillectomy    . Tee without cardioversion Left 05/07/2013    Procedure: TRANSESOPHAGEAL ECHOCARDIOGRAM (TEE);  Surgeon: Lars Masson, MD;  Location: [facility] ENDOSCOPY;  Service: Cardiovascular;  Laterality: Left;  . Cardioversion Left 05/07/2013    Procedure: CARDIOVERSION;  Surgeon: Lars Masson, MD;  Location: [facility] ENDOSCOPY;  Service: Cardiovascular;  Laterality: Left;    Allergies  Allergies  Allergen Reactions  . Penicillins Rash    HPI  58 year old male with a history of diabetes, hypertension, who was recently hospitalized with acute combined systolic and diastolic heart failure with LV ejection fraction 30-35%. The patient was found to be in atrial flutter with 2:1 block, he was cardioverted back to the sinus rhythm and was treated for significant fluid overload. The patient responded to diuretics very well and was started on ACE inhibitor, beta blocker, as well as the spironolactone.  Echocardiogram showed mildly dilated left ventricle with moderately decreased LV function EF 30-35%, left atrium is moderately dilated, right ventricle is dilated with moderately decreased function, and mild pulmonary hypertension. He  also had grades 2 diastolic dysfunction.  After 1 month the patient felt significantly better. No lower extremity edema, no PND or orthopnea, DOE on moderate exercise such as walking flight of stairs. He complains of SOB at the work environment where he is exposed to tobacco dust and chemicals.    Today he is feeling back to baseline, he is involved in cardiac rehab, full of energy. He will be relocating to [place] in this months. He will still follow with [place]. No CP, SOB, no LE edema. He has a new girlfriend and is inquiring about sildenafil. He is complaining of sinus drainage and pain, no fever. He started a nutrition progream as has lost 12 lbs.   04/02/2014 - The patient is coming after 6 weeks. He was relocated to [place], he has a better job position and a fiance he is going to marry in 2 weeks. They stick to healthy diet and he continues to loose weight. He feels that Wellbutrin helped him with depression and he quit smoking. He denies CP, SOB, palpitations, orthopnea, PND. He is complaint with his meds.  Home Medications  Prior to Admission medications   Medication Sig Start Date End Date Taking? Authorizing Provider  aspirin EC 81 MG tablet Take 81 mg by mouth daily.   Yes Historical Provider, MD  furosemide (LASIX) 40 MG tablet Take 1 tablet (40 mg total) by mouth 2 (two) times daily. 05/12/13  Yes Rhonda G Barrett, PA-C  guaiFENesin (MUCINEX) 600 MG 12 hr tablet Take 600 mg by mouth 2 (two) times  daily.   Yes Historical Provider, MD  hydrALAZINE (APRESOLINE) 25 MG tablet Take 1 tablet (25 mg total) by mouth 3 (three) times daily with meals. 05/12/13  Yes Rhonda G Barrett, PA-C  lisinopril (PRINIVIL,ZESTRIL) 40 MG tablet Take 1 tablet (40 mg total) by mouth daily. 05/12/13  Yes Rhonda G Barrett, PA-C  metFORMIN (GLUCOPHAGE) 1000 MG tablet Take 1,000 mg by mouth 2 (two) times daily with a meal.   Yes Historical Provider, MD  metoprolol succinate (TOPROL-XL) 25 MG 24 hr tablet Take 25 mg by  mouth every morning. 05/12/13  Yes Rhonda G Barrett, PA-C  potassium chloride SA (K-DUR,KLOR-CON) 20 MEQ tablet Take 1 tablet (20 mEq total) by mouth 2 (two) times daily. 05/19/13  Yes Dorothea Ogle, MD  Rivaroxaban (XARELTO) 20 MG TABS tablet Take 1 tablet (20 mg total) by mouth daily. 05/19/13  Yes Dorothea Ogle, MD  sitaGLIPtin (JANUVIA) 50 MG tablet Take 50 mg by mouth daily.   Yes Historical Provider, MD  spironolactone (ALDACTONE) 25 MG tablet Take 1 tablet (25 mg total) by mouth daily. 05/12/13  Yes Rhonda G Barrett, PA-C    Family History  Family History  Problem Relation Age of Onset  . Hypertension      family history    Social History  History   Social History  . Marital Status: Single    Spouse Name: N/A    Number of Children: N/A  . Years of Education: N/A   Occupational History  . Not on file.   Social History Main Topics  . Smoking status: Former Smoker -- 1.00 packs/day for 33 years    Types: Cigarettes  . Smokeless tobacco: Never Used  . Alcohol Use: No  . Drug Use: No  . Sexual Activity: Yes   Other Topics Concern  . Not on file   Social History Narrative  . No narrative on file     Review of Systems, as per history of present illness otherwise negative General:  No chills, fever, night sweats or weight changes.  Cardiovascular:  No chest pain, dyspnea on exertion, edema, orthopnea, palpitations, paroxysmal nocturnal dyspnea. Dermatological: No rash, lesions/masses Respiratory: No cough, dyspnea Urologic: No hematuria, dysuria Abdominal:   No nausea, vomiting, diarrhea, bright red blood per rectum, melena, or hematemesis Neurologic:  No visual changes, wkns, changes in mental status. All other systems reviewed and are otherwise negative except as noted above.  Physical Exam  Blood pressure 130/84, pulse 77, height  (1.956 m), weight 311 lb (141.069 kg).  General: Pleasant, NAD Psych: Normal affect. Neuro: Alert and oriented X 3. Moves  all extremities spontaneously. HEENT: Normal  Neck: Supple without bruits or JVD. Lungs:  Resp regular and unlabored, CTA. Heart: RRR no s3, s4, or murmurs. Abdomen: Soft, non-tender, non-distended, BS + x 4.  Extremities: No clubbing, cyanosis or edema. DP/PT/Radials 2+ and equal bilaterally.  Accessory Clinical Findings  ECG - sinus rhythm, 86 beats per minute, right bundle branch block, left anterior fascicular block, bifascicular block, abnormal EKG.  TTE 05/08/2013 - Left ventricle: The cavity size was mildly dilated. Wall thickness was increased in a pattern of moderate LVH. The estimated ejection fraction was 35%. Diffuse hypokinesis. Definity contrast was given. Features are consistent with a pseudonormal left ventricular filling pattern, with concomitant abnormal relaxation and increased filling pressure (grade 2 diastolic dysfunction). - Aortic valve: Transvalvular velocity was within the normal range. There was no stenosis. - Aorta: Mildly dilated ascending aorta. Proximal ascending aorta  measures 41 mm. Aortic root dimension: 33mm (ED). - Mitral valve: Trivial regurgitation. - Left atrium: The atrium was moderately dilated. - Right ventricle: The cavity size was mildly dilated. Systolic function was moderately reduced. - Right atrium: The atrium was moderately dilated. - Tricuspid valve: Peak RV-RA gradient: 73mm Hg (S). - Pulmonary arteries: PA systolic pressure 34-38 mmHg. - Systemic veins: IVC measured 2.4 cm with < 50% respirophasic variation, suggesting RA pressure 11-15 mmHg. Impressions:  - Mildly dilated LV with moderate LV hypertrophy. EF 35% with diffuse hypokinesis. Moderate diastolic dysfunction. Mildly dilated RV with moderately decreased systolic function. Mild pulmonary hypertension.  TTE 06/17/2013 Study Conclusions  - Left ventricle: The cavity size was normal. Wall thickness was normal. Systolic function was normal. The estimated ejection  fraction was in the range of 50% to 55%. Wall motion was normal; there were no regional wall motion abnormalities. Doppler parameters are consistent with abnormal left ventricular relaxation (grade 1 diastolic dysfunction). - Left atrium: The atrium was mildly dilated. - Right atrium: The atrium was mildly dilated. - Atrial septum: No defect or patent foramen ovale was identified. Impressions:  - Compared to the prior study from 05/07/2013 LV EF is mildly improved, currently 50-55%.  Ecg: NSR, LAD, RBBB, unchanged from prior    Assessment & Plan  1. Chronic systolic congestive heart failure - currently NYHA I The patient is euvolemic, he ould prefer to be off diuretics, we will check BMP and decide.  LVEF improved from 35% to 50-55%.  He has lolst another 2 lbs.  2. 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 Atrial flutter currently in sinus rhythm following cardioversion - patient is in sinus rhythm, we will continue beta blockers, off Xarelto since March 2015.   4. Type 2 diabetes mellitus - HbA1c 8.6%, he will follow with his PCP. Diabetic neuropathy, continue Lyrica 50 mg po TID.  5. Obesity - the patient changed his diet and started to exercise slowly and he feels he already lost some weight  6. Depression, anxiety - feeling much better on Wellbutrin.  7. Smoking cessation - quit on wellbutrin  8. ED - Viagra PRN.   Followup in [place], Faustino Congress, MD 04/02/2014, 4:04 PM

## 2014-04-02 NOTE — Patient Instructions (Signed)
Your physician recommends that you continue on your current medications as directed. Please refer to the Current Medication list given to you today.   WE REFILLED YOUR MEDICATIONS FOR YOU    Your physician recommends that you return for lab work in: [employer])    Your physician wants you to follow-up in: ONE YEAR WITH DR Johnell Comings will receive a reminder letter in the mail two months in advance. If you don't receive a letter, please call our office to schedule the follow-up appointment.

## 2014-04-03 LAB — BASIC METABOLIC PANEL
BUN: 13 mg/dL (ref 6–23)
CO2: 25 mEq/L (ref 19–32)
Calcium: 9.3 mg/dL (ref 8.4–10.5)
Chloride: 104 mEq/L (ref 96–112)
Creat: 1.11 mg/dL (ref 0.50–1.35)
Glucose, Bld: 99 mg/dL (ref 70–99)
Potassium: 3.8 mEq/L (ref 3.5–5.3)
Sodium: 139 mEq/L (ref 135–145)

## 2014-07-11 IMAGING — CR DG HAND COMPLETE 3+V*R*
3 series · 3 of 3 positions shown · non-contrast
Comparison: None.

CLINICAL DATA: Right hand pain centered in the thumb

EXAM:
RIGHT HAND - COMPLETE 3+ VIEW

[x hand pa right]
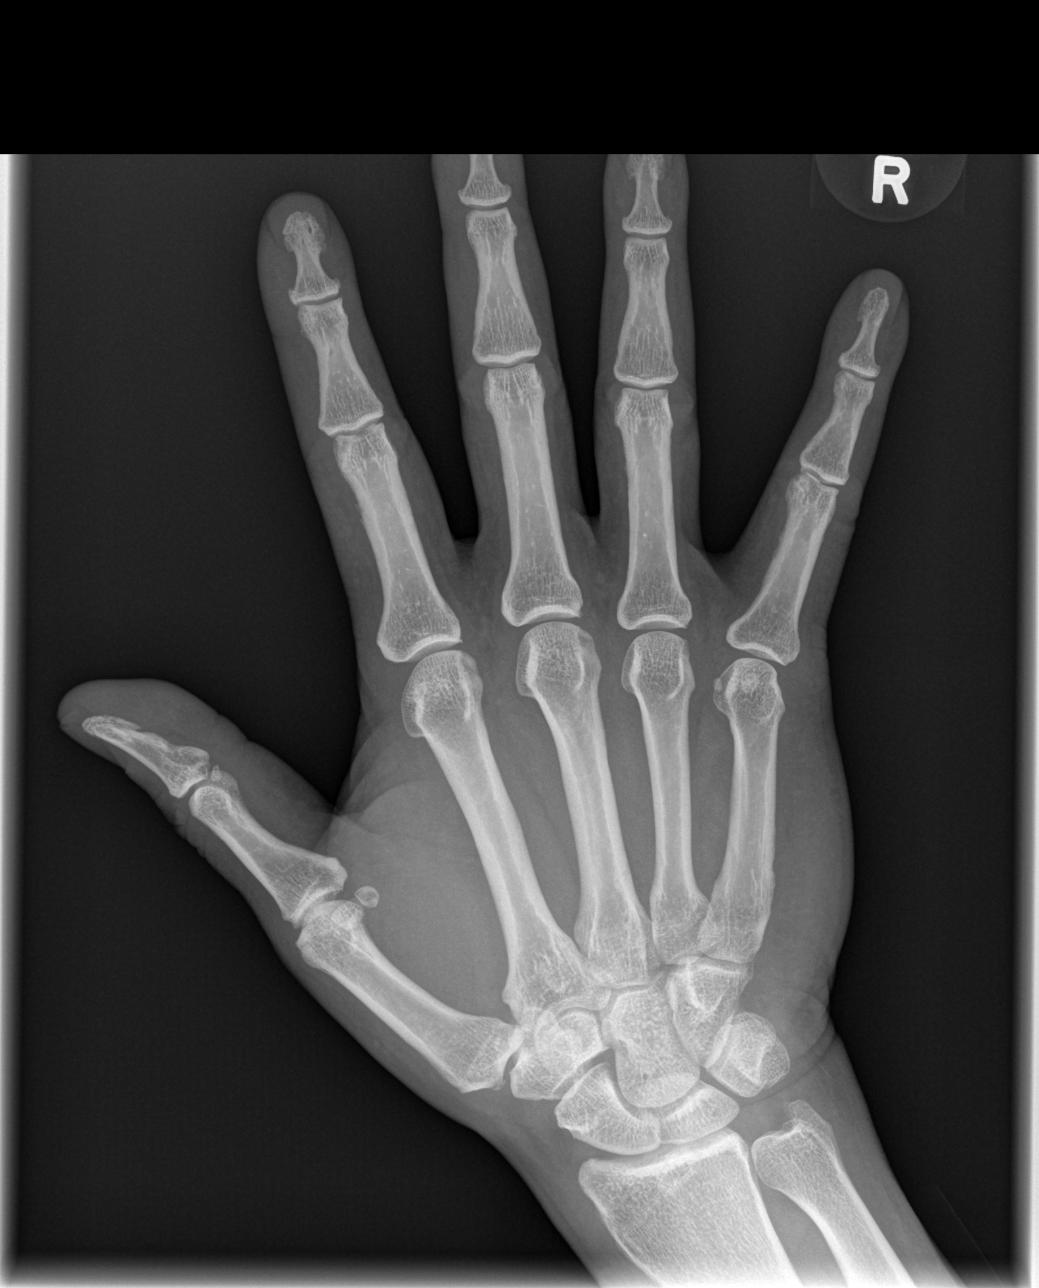

[x hand oblique right]
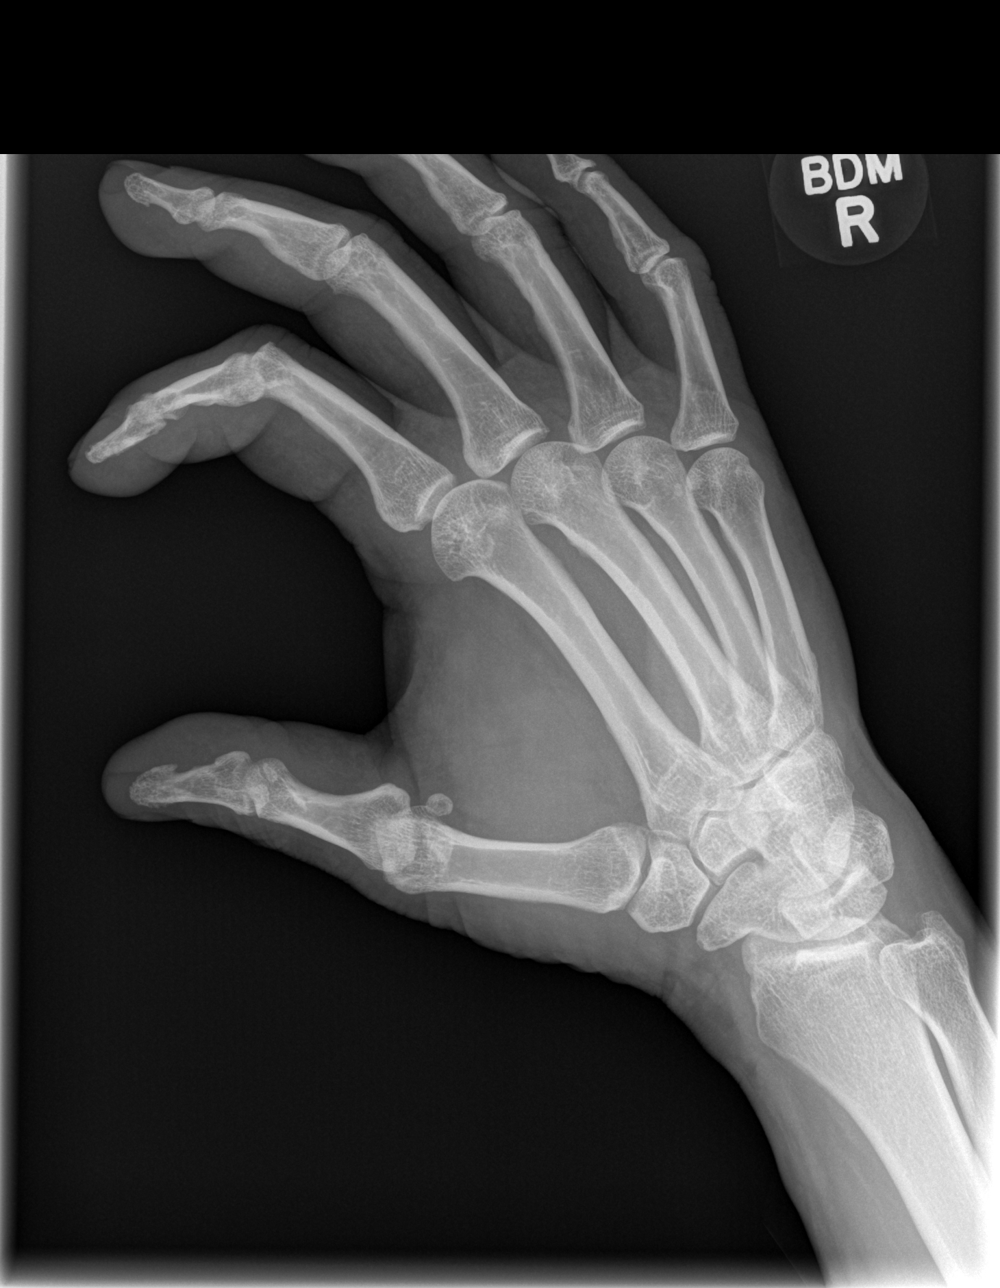

[x hand lat right]
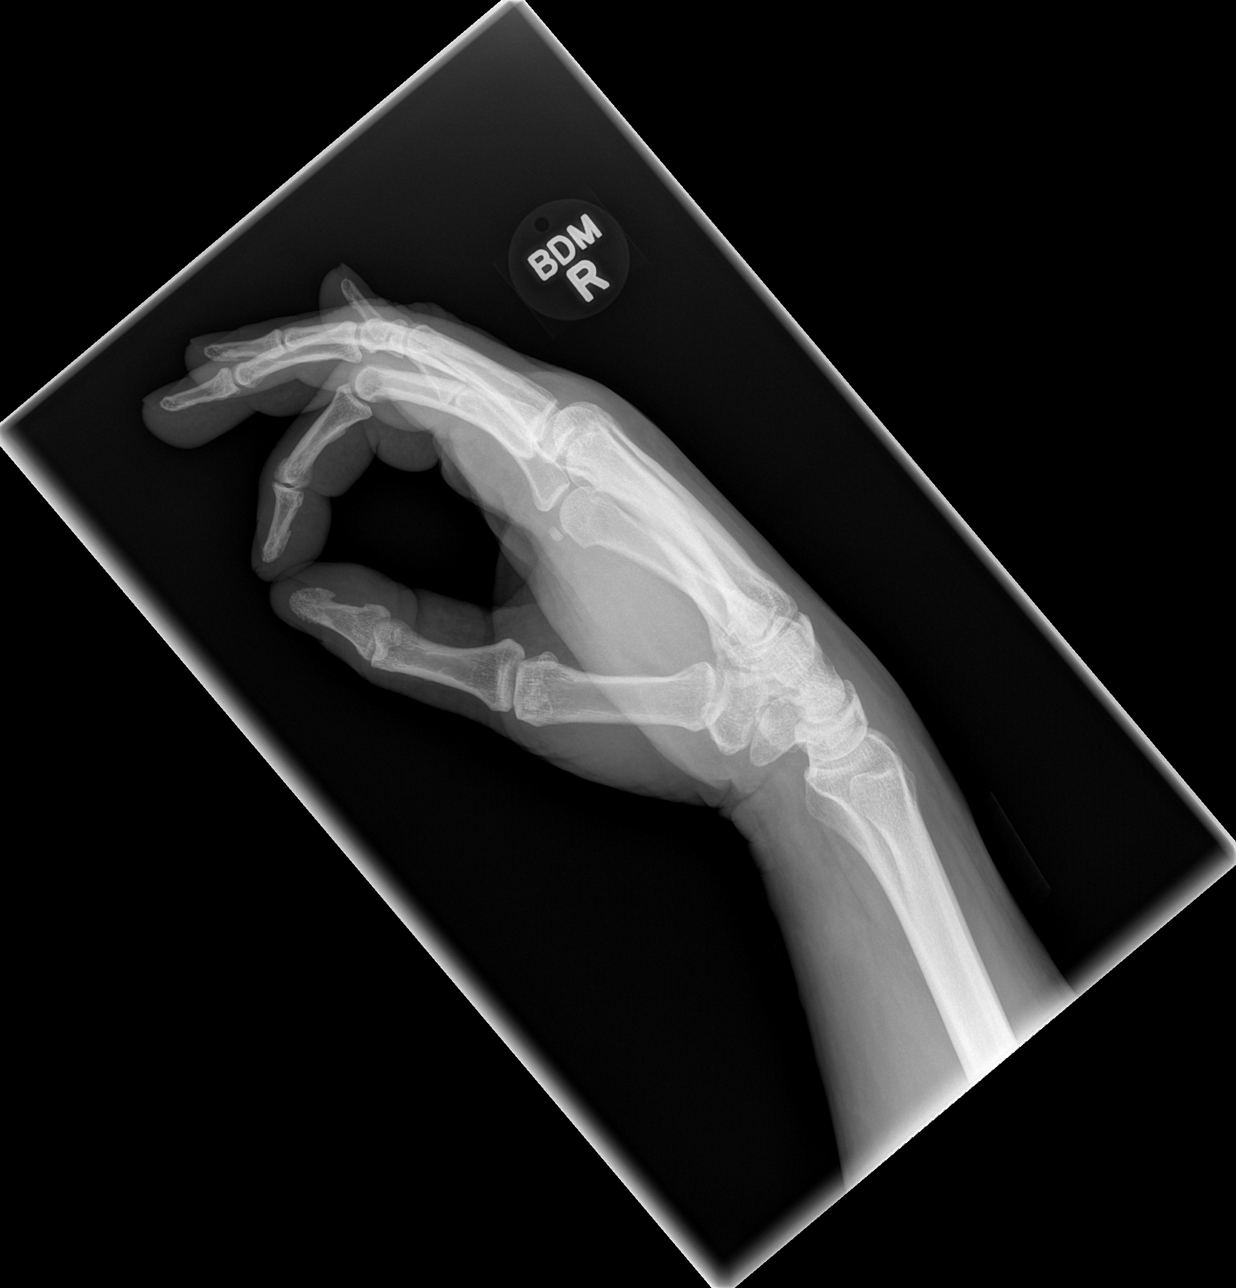

[3 of 3 positions shown; findings below may reference images not displayed]

FINDINGS: The bones of the right hand appear adequately mineralized. There is
no evidence of an acute or old fracture nor evidence of dislocation.
The interphalangeal joints, the metacarpophalangeal joints, and the
carpometacarpal joints exhibit no significant degenerative change.
One cannot absolutely exclude mild degenerative change at the first
carpometacarpal joint. No intercarpal joint abnormality is
demonstrated.
IMPRESSION: There is no acute bony abnormality of the right hand. One cannot
exclude minimal degenerative change of the first carpometacarpal
joint.

## 2015-03-08 ENCOUNTER — Encounter: Payer: Self-pay | Admitting: Pharmacist
